# Patient Record
Sex: Male | Born: 1935 | Race: Black or African American | Hispanic: No | Smoking: Former smoker
Health system: Southern US, Community
[De-identification: ages and names within clinical notes are randomized; demographics above are authoritative.]

## PROBLEM LIST (undated history)

## (undated) DIAGNOSIS — Z950 Presence of cardiac pacemaker: Secondary | ICD-10-CM

## (undated) DIAGNOSIS — C189 Malignant neoplasm of colon, unspecified: Secondary | ICD-10-CM

## (undated) DIAGNOSIS — I442 Atrioventricular block, complete: Secondary | ICD-10-CM

## (undated) DIAGNOSIS — M1990 Unspecified osteoarthritis, unspecified site: Secondary | ICD-10-CM

## (undated) DIAGNOSIS — I739 Peripheral vascular disease, unspecified: Secondary | ICD-10-CM

## (undated) DIAGNOSIS — I10 Essential (primary) hypertension: Secondary | ICD-10-CM

## (undated) HISTORY — DX: Essential (primary) hypertension: I10

## (undated) HISTORY — PX: KNEE SURGERY: SHX244

## (undated) HISTORY — DX: Presence of cardiac pacemaker: Z95.0

## (undated) HISTORY — PX: INSERT / REPLACE / REMOVE PACEMAKER: SUR710

## (undated) HISTORY — DX: Peripheral vascular disease, unspecified: I73.9

## (undated) HISTORY — DX: Atrioventricular block, complete: I44.2

---

## 2011-08-18 ENCOUNTER — Other Ambulatory Visit (HOSPITAL_COMMUNITY): Payer: Self-pay | Admitting: Internal Medicine

## 2011-08-18 DIAGNOSIS — I70219 Atherosclerosis of native arteries of extremities with intermittent claudication, unspecified extremity: Secondary | ICD-10-CM

## 2011-08-26 ENCOUNTER — Ambulatory Visit (HOSPITAL_COMMUNITY)
Admission: RE | Admit: 2011-08-26 | Discharge: 2011-08-26 | Disposition: A | Discharge status: Home / Self Care | Payer: Medicare Other | Source: Ambulatory Visit | Attending: Internal Medicine | Admitting: Internal Medicine

## 2011-08-26 DIAGNOSIS — I743 Embolism and thrombosis of arteries of the lower extremities: Secondary | ICD-10-CM | POA: Insufficient documentation

## 2011-08-26 DIAGNOSIS — Q619 Cystic kidney disease, unspecified: Secondary | ICD-10-CM | POA: Insufficient documentation

## 2011-08-26 DIAGNOSIS — I70219 Atherosclerosis of native arteries of extremities with intermittent claudication, unspecified extremity: Secondary | ICD-10-CM | POA: Insufficient documentation

## 2011-08-26 MED ORDER — IOHEXOL 350 MG/ML SOLN
150.0000 mL | Freq: Once | INTRAVENOUS | Status: AC | PRN
  Administered 2011-08-26: 150 mL via INTRAVENOUS

## 2011-09-01 HISTORY — PX: TRANSTHORACIC ECHOCARDIOGRAM: SHX275

## 2011-09-09 HISTORY — PX: PACEMAKER INSERTION: SHX728

## 2011-11-10 LAB — PACEMAKER DEVICE OBSERVATION

## 2012-01-28 ENCOUNTER — Encounter (HOSPITAL_COMMUNITY): Payer: Self-pay | Admitting: *Deleted

## 2012-01-28 ENCOUNTER — Emergency Department (HOSPITAL_COMMUNITY)
Admission: EM | Admit: 2012-01-28 | Discharge: 2012-01-28 | Disposition: A | Discharge status: Home / Self Care | Payer: Medicare Other | Attending: Emergency Medicine | Admitting: Emergency Medicine

## 2012-01-28 ENCOUNTER — Emergency Department (HOSPITAL_COMMUNITY): Payer: Medicare Other

## 2012-01-28 DIAGNOSIS — R0602 Shortness of breath: Secondary | ICD-10-CM | POA: Insufficient documentation

## 2012-01-28 MED ORDER — ALBUTEROL SULFATE (5 MG/ML) 0.5% IN NEBU
5.0000 mg | INHALATION_SOLUTION | Freq: Once | RESPIRATORY_TRACT | Status: DC
  Filled 2012-01-28: qty 1

## 2012-07-19 IMAGING — CT CT ANGIO AOBIFEM WO/W CM
1 of 9 series · 10 of 46 positions shown, 12 images · IV contrast (Omnipaque 300)
Comparison: None.

CLINICAL DATA: Abnormal ultrasound, bilateral lower extremity
swelling and intermittent claudication, evaluate for peripheral
arterial disease.

CT ANGIOGRAPHY OF ABDOMINAL AORTA WITH ILIOFEMORAL RUNOFF
TECHNIQUE: Multidetector CT imaging of the abdomen, pelvis and
lower extremities was performed using the standard protocol during
bolus administration of intravenous contrast.  Multiplanar CT image
reconstructions including MIPs were obtained to evaluate the
vascular anatomy.
Contrast: 150mL OMNIPAQUE IOHEXOL 350 MG/ML SOLN

[Series 4: cta 3.0 b31f pacs · axial · 0.76mm/px · z∈[-1264,-48]mm · 10 of 467 slices shown, 12 images]
[im 46/467  soft-tissue]
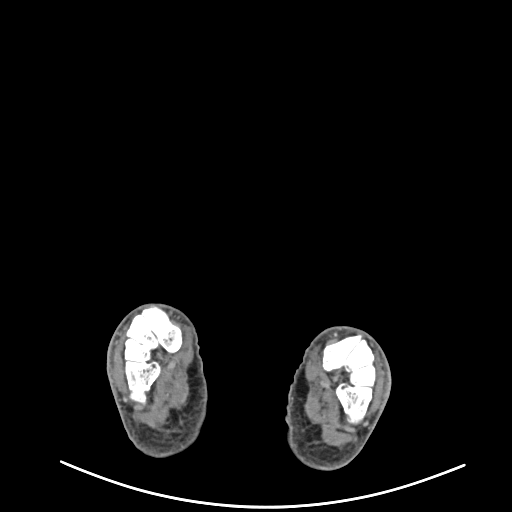
[im 46/467  bone]
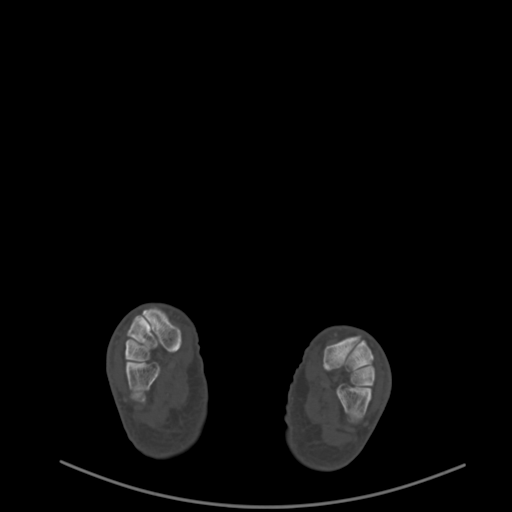
[im 106/467  soft-tissue]
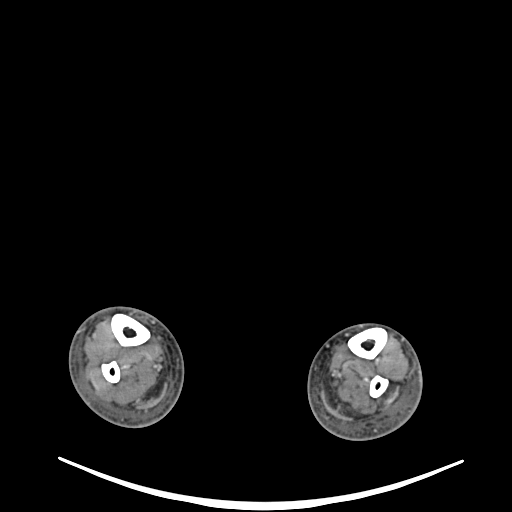
[im 166/467  soft-tissue]
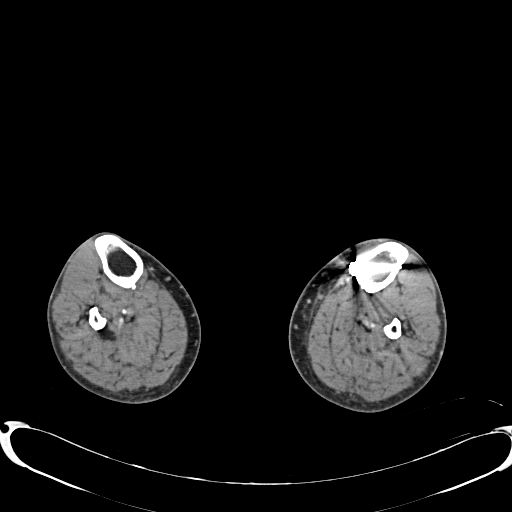
[im 241/467  soft-tissue]
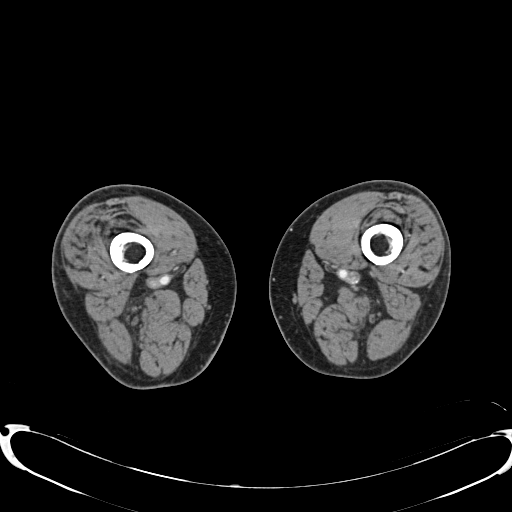
[im 301/467  soft-tissue]
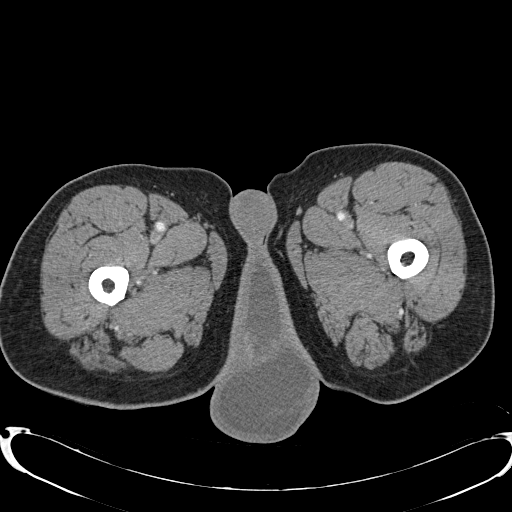
[im 361/467  soft-tissue]
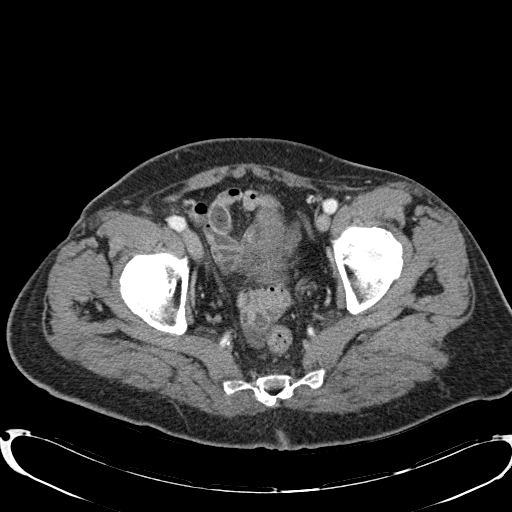
[im 406/467  lung]
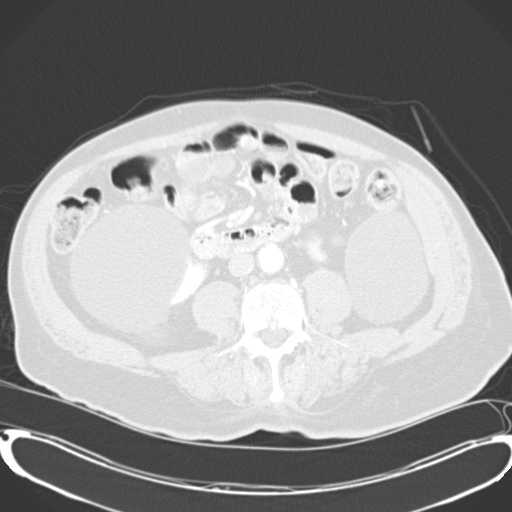
[im 421/467  soft-tissue]
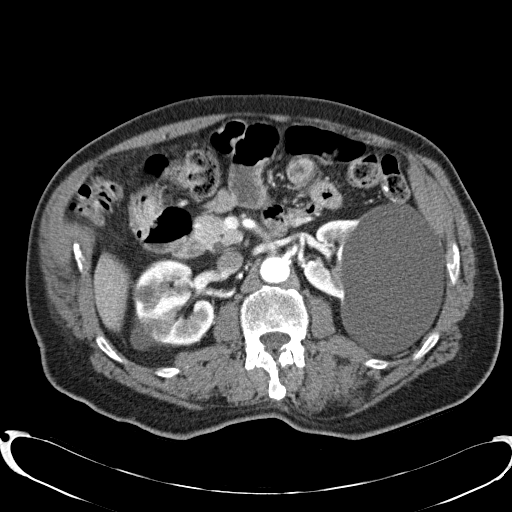
[im 421/467  lung]
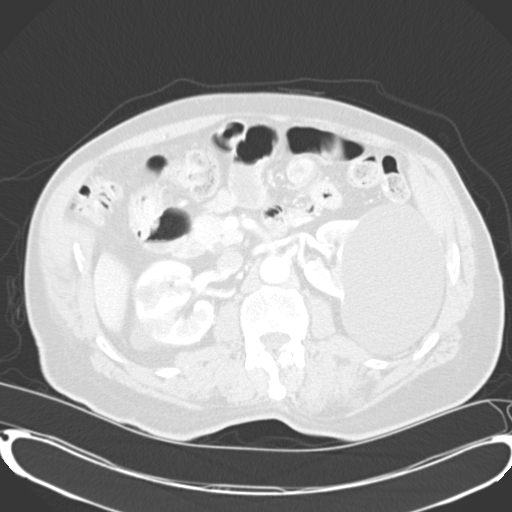
[im 436/467  lung]
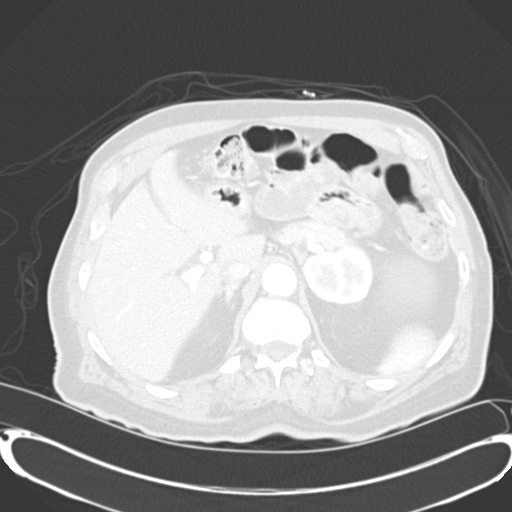
[im 451/467  lung]
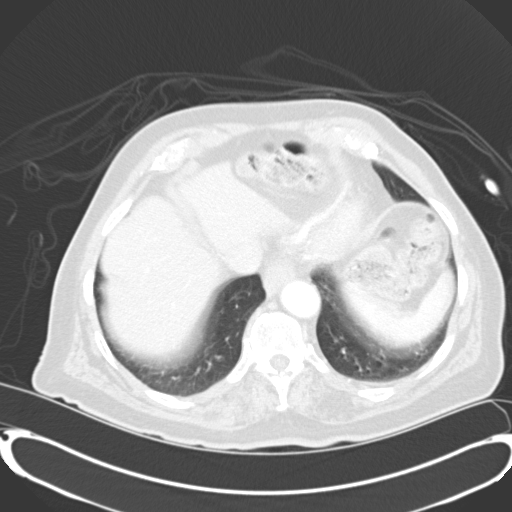

[10 of 46 positions shown; findings below may reference images not displayed]

Vascular Findings:

Aorta:  Scattered minimal atherosclerotic calcifications within the
nonaneurysmal abdominal aorta.  No aortic dissection.  No
periaortic stranding.

Celiac artery:  There is minimal primarily noncalcified
circumferential atherosclerotic plaque involving the origin and
proximal aspect of the celiac artery not resulting hemodynamically
significant stenosis.  Classical branching pattern.

SMA:  Widely patent.  Classical branching pattern.

Right renal artery:  Solitary; widely patent without
hemodynamically significant narrowing.

Left renal artery:  Solitary; widely patent without hemodynamically
significant narrowing.  The left renal artery is noted to arise
slightly anteriorly from the abdominal aorta caudal and inferior to
the left lateral aspect of the SMA origin.

IMA:  Widely patent.

RIGHT LOWER EXTREMITY:

Pelvic vasculature:  There is scattered primarily calcified
eccentric atherosclerotic plaque within the right common iliac
artery, not resulting in hemodynamically significant stenosis.  The
right external iliac artery is noted to be mildly torturuous but
largely free of atherosclerotic disease.  There is scattered
minimal atherosclerotic calcifications within a nonaneurysmal right
internal iliac artery.

Right common femoral artery:  Minimal eccentric, calcified
atherosclerotic plaque, not resulting hemodynamically significant
narrowing.

Right deep femoral artery:  Widely patent without hemodynamically
significant narrowing.

Right superficial femoral artery: Patent, however there is
significant atherosclerotic disease primarily involving the mid and
distal aspect of the right superficial femoral artery, resulting in
multiple tandem multiple tandem areas of hemodynamically
significant narrowing worse within the distal right SFA regional to
the abductor canal or Mr. calcified and noncalcified
atherosclerotic plaque results in greater than 70% luminal
narrowing (image 197, series four).

Right above-knee popliteal artery:  There is scattered
atherosclerotic plaque with a mixture of calcified 9 ounces chronic
plaque resulting in approximately 40% luminal narrowing just
superior to the patella (image 247).

Right below knee popliteal artery:  Circumferential noncalcified
atherosclerotic plaque is not resulting critically significant
narrowing.

Right lower leg:  Evaluation of the bony vasculature is somewhat
limited secondary to suboptimal vessel opacification.  The right
anterior tibial artery appears occluded shortly after its origin
(image 297 calcified atherosclerotic plaque at the origin of the
right popliteal artery with multiple areas of fairly significant
narrowing.  There is collateralization of flow to the right
posterior tibial artery is via the peroneal artery.  The posterior
tibial artery is visualized to the level of the hind foot.  Into
the dorsalis pedis artery is not definitely identified.

LEFT LOWER EXTREMITY:

Pelvic vasculature:  There is minimal eccentric calcified
atherosclerotic plaque within the left common iliac artery, not
resulting in hemodynamically significant stenosis.  The left
external iliac artery is mildly tortuous but free of
hemodynamically significant narrowing.  There is no significant
atherosclerotic disease within the left internal iliac artery.

Left common femoral artery:  There is a minimal amount of eccentric
atherosclerotic plaque within the left common femoral artery, not
resulting in hemodynamically significant narrowing.

Left deep femoral artery:  Widely patent without hemodynamically
significant narrowing.

Left superficial femoral artery: Primarily noncalcified eccentric
atherosclerotic plaque involving the origin and proximal aspect of
the left superficial femoral results in greater than 50% luminal
narrowing (image 122).  There is multifocal tandem severe narrowing
within the distal aspect of the left superficial femoral artery
regional to the abductor canal resulting in several areas of
greater than 70% luminal narrowing (images 191 and 202).

Left above-knee popliteal artery:  A mixture of calcified and
noncalcified eccentric atherosclerotic plaque within the above-knee
popliteal artery does not result in hemodynamically significant
narrowing.

Left below-knee popliteal artery:  Predominantly noncalcified
concentric atherosclerotic plaque does not result in
hemodynamically significant narrowing.

Left lower leg:  Evaluation of the lower right vasculature is
degraded secondary to suboptimal vessel calcification and scattered
vascular calcifications, drain evaluation for intraluminal
contrast.  Both the left sided anterior and posterior tibial
arteries appear occluded at their origins.  There is a single
vessel runoff to the left foot via the left peroneal artery.  The
peroneal artery is noted to reconstitute the distal aspect of the
left posterior tibial artery above the level of the ankle (image
387).  A patent left sided dorsalis pedis artery is not
definitively identified.

 Review of the MIP images confirms the above findings.

Nonvascular findings:

Review of the abdominal organs is limited to the arterial phase of
enhancement.

Normal hepatic contour.  There is a hypoattenuating (7 HU) 1.4 cm
cyst within the medial aspect of the anterior segment of the right
lobe of the liver (image 27, series 4).  The gallbladder is not
visualized, presumably surgically absent.  No definite intra or
extrahepatic biliary duct dilatation.  No ascites.

There is symmetric enhancement of the bilateral kidneys.  Multiple
large bilateral exophytic renal cysts are identified with a
dominant exophytic cyst arising from the inferior pole right kidney
measuring approximately 10.2 x 8.7 by 9.3 cm (axial image 70,
coronal image 47 and dominant partially exophytic cyst arising from
the mid pole of the left kidney measuring approximately 11.7 x
x 12.4 cm (axial image 48, coronal image 14 of 49).  No definite
evidence of urinary obstruction.  No perinephric stranding.  Normal
early arterial phase appearance of the bilateral adrenal glands,
pancreas and spleen.  Incidental note is made of a small splenule.

Moderate colonic stool burden without definite evidence of
obstruction.  Unremarkable diverticular burden given the patient's
age.  Bowel is normal in course and caliber without wall thickening
or evidence of obstruction.  Normal appearance of the appendix.  No
pneumoperitoneum, pneumatosis or portal venous gas.  No
retroperitoneal, mesenteric, pelvic or inguinal lymphadenopathy.

Borderline enlarged prostate.  There is a small amount of free
fluid within the pelvis. Note is made of large bilateral
hydroceles.

Limited visualization of the lung bases is obscured secondary to
patient respiratory artifact.  There is minimal linear atelectasis
within the inferior segment of the lingula.  No focal airspace
opacities.  No pleural effusion.  Borderline cardiomegaly.  No
pericardial effusion.

No acute or aggressive osseous abnormalities..  Multilevel moderate
to severe lumbar spine degenerative change, worst at L4 - L5 with
near complete vertebral body height loss and para osseous fusion of
the intervertebral disc space level.  Ex vacuo disc phenomenon seen
at L2 - L3-L3 - L4. Bilateral knee degenerative change. Post side
plate and screw fixation of prior tibial plateau fracture.  No
definite evidence of hardware failure or loosening.
IMPRESSION: 1.  Negative for abdominal aortic aneurysm.

2.  Multi focal tandem areas of hemodynamically significant
narrowing within the mid and distal aspect of the right superficial
femoral artery with greater than 70% luminal narrowing at the level
of the adductor canal.

3.  Two-vessel runoff to the right foot via the posterior tibial
and peroneal arteries.  The right anterior tibial artery is
occluded at its origin.  Non opacification of the right dorsalis
pedis artery.

4. Eccentric noncalcified atherosclerotic plaque at the origin of
the left superficial femoral artery likely results in greater than
50% luminal narrowing.  Multifocal tandem areas of hemodynamically
significant narrowing within the mid and distal aspect of left
superficial femoral artery results in several areas of greater than
70% luminal narrowing at the level of the abductor canal.

5. Single vessel runoff to the left foot via the peroneal artery.
The left posterior and anterior left tibial arteries are occluded
at their origin.  The peroneal artery appears to reconstitute the
distal aspect of the left posterior tibial artery superior to the
ankle. Non  opacification of the left dorsalis pedis artery.

6.  Hepatic cyst and multiple large exophytic bilateral renal cysts
are incidentally noted.

7.  Incidental note is made of large bilateral hydroceles.

## 2012-08-02 ENCOUNTER — Other Ambulatory Visit (HOSPITAL_COMMUNITY): Payer: Self-pay | Admitting: Internal Medicine

## 2012-08-02 DIAGNOSIS — I739 Peripheral vascular disease, unspecified: Secondary | ICD-10-CM

## 2012-08-12 LAB — PACEMAKER DEVICE OBSERVATION

## 2012-08-14 ENCOUNTER — Ambulatory Visit (HOSPITAL_COMMUNITY)
Admission: RE | Admit: 2012-08-14 | Discharge: 2012-08-14 | Disposition: A | Discharge status: Home / Self Care | Payer: Medicare Other | Source: Ambulatory Visit | Attending: Internal Medicine | Admitting: Internal Medicine

## 2012-08-14 DIAGNOSIS — I739 Peripheral vascular disease, unspecified: Secondary | ICD-10-CM

## 2012-08-14 NOTE — Progress Notes (Signed)
ABIs and lower extremity arterial duplex doppler was completed.  Larene Pickett RVT

## 2012-09-08 ENCOUNTER — Encounter: Payer: Self-pay | Admitting: Internal Medicine

## 2012-09-15 ENCOUNTER — Ambulatory Visit (INDEPENDENT_AMBULATORY_CARE_PROVIDER_SITE_OTHER): Payer: Medicare Other | Admitting: Internal Medicine

## 2012-09-15 ENCOUNTER — Encounter: Payer: Self-pay | Admitting: Internal Medicine

## 2012-09-15 VITALS — BP 150/58 | HR 96 | Ht 71.0 in | Wt 200.0 lb

## 2012-09-15 DIAGNOSIS — R5381 Other malaise: Secondary | ICD-10-CM

## 2012-09-15 DIAGNOSIS — M545 Low back pain, unspecified: Secondary | ICD-10-CM

## 2012-09-15 DIAGNOSIS — I442 Atrioventricular block, complete: Secondary | ICD-10-CM

## 2012-09-15 DIAGNOSIS — Z950 Presence of cardiac pacemaker: Secondary | ICD-10-CM

## 2012-09-15 DIAGNOSIS — R0989 Other specified symptoms and signs involving the circulatory and respiratory systems: Secondary | ICD-10-CM

## 2012-09-15 DIAGNOSIS — R0609 Other forms of dyspnea: Secondary | ICD-10-CM

## 2012-09-15 DIAGNOSIS — I739 Peripheral vascular disease, unspecified: Secondary | ICD-10-CM

## 2012-09-15 DIAGNOSIS — R5383 Other fatigue: Secondary | ICD-10-CM

## 2012-09-15 DIAGNOSIS — I10 Essential (primary) hypertension: Secondary | ICD-10-CM

## 2012-09-15 MED ORDER — TRAMADOL HCL 50 MG PO TABS: 50.0000 mg | ORAL_TABLET | Freq: Four times a day (QID) | ORAL | Status: DC | PRN

## 2012-09-15 NOTE — Progress Notes (Signed)
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 Recently, unfortunately, he had developed heart block and underwent a pacemaker and has been followed by Dr. Royann Shivers in our practice. The pacemaker was recently interrogated and shows that he does have an escape rhythm at about 40 beats per minute and seems to be doing well, showing about 30% atrial pacing, 97.5% ventricular pacing. He is set up with a [employer] remote monitoring system which seems to be working. His main complaint today is progressive fatigue. He has been having that for over the past 6 months with some associated shortness of breath. He had recurrent cough, which I thought was due to ACE-I and I switched him to an ARB.  The family said it did not improve, so he went back to lisinopril HCTZ.  He also has severe PAD. Recent repeat dopplers demonstrate ABI's of 0.51 on the right and 0.66 on the left. Both distal SFA's appear near occluded and there are occluded anterior and posterior tibial arteries bilaterally. Despite this, however, he reports no claudication. In fact, he reports pain when lying flat on his back (?pseudoclaudication). He flatly denies angina.    PMHx:  Past Medical History  Diagnosis Date  . Peripheral arterial disease     Arterial dopplers done  08/10/2011  CT angiogram 08/26/2011 showed reduced ABIs bilaterally with ABI of 0.54on the right and 0.72 on the left  . Hypertension     Past Surgical History  Procedure Laterality Date  . Knee surgery    . Pacemaker insertion  09/09/2011    Adapta Versa DR Dual chamber  Medtronic  last checked 11/10/2011,      FAMHx:  Family History  Problem Relation Age of Onset  . Cancer Son     colon  . Cancer Son     brain    SOCHx:   reports that he has quit smoking. He does not have any smokeless tobacco history on file. He reports that he does not drink alcohol or use illicit drugs.  ALLERGIES:  No Known Allergies  ROS: A comprehensive review of systems was negative except for: Respiratory: positive for cough and dyspnea on exertion Cardiovascular: positive for dyspnea and fatigue Musculoskeletal: positive for back pain Neurological: positive for paresthesia  HOME MEDS: Current Outpatient Prescriptions  Medication Sig Dispense Refill  . aspirin EC 81 MG tablet Take 81 mg by mouth daily.      . ferrous sulfate 325 (65 FE) MG tablet Take 325 mg by mouth daily with breakfast.      . lisinopril-hydrochlorothiazide (PRINZIDE,ZESTORETIC) 20-25 MG per tablet Take 20-25 tablets by mouth daily.      . valsartan-hydrochlorothiazide (DIOVAN-HCT) 160-25 MG per tablet Take 1 tablet by mouth daily.       No current facility-administered medications for this visit.    LABS/IMAGING: No results found for this or any previous visit (from the past 48 hour(s)). No results found.  VITALS: BP 150/58  Pulse 96  Ht 5\' 11"  (1.803 m)  Wt 200 lb (90.719 kg)  BMI 27.91 kg/m2  EXAM: General appearance: alert, appears older  than stated age and no distress Neck: no adenopathy, no carotid bruit, no JVD, supple, symmetrical, trachea midline and thyroid not enlarged, symmetric, no tenderness/mass/nodules Lungs: diminished breath sounds bibasilar Heart: regular rate and rhythm, S1, S2 normal, no murmur, click, rub or gallop Abdomen: soft, non-tender; bowel sounds normal; no masses,  no organomegaly Extremities: extremities normal, atraumatic, no cyanosis or edema Pulses: weak pulses bilaterally Skin: Skin color, texture, turgor normal. No rashes or lesions Neurologic: Grossly normal  EKG: Not performed  today  ASSESSMENT: 1. Complete heart block, s/p Medtronic dual chamber pacemaker 2. PAD without lifestyle limiting claudication 3. Pseudoclaudication 4. Hypertension 5. Dyspnea on exertion and progressive fatigue  PLAN: 1.   Mr. Meuth has been describing more dyspnea with exertion and fatigue. Given his significant PAD, he is at much higher risk for CAD. I would like to obtain a stress test (he cannot walk on a treamill) to look for obstructive disease as a cause of his progressive fatigue and dyspnea. He does seem to report pseudoclaudication symptoms (leg pain worse when lying flat on his back), which make me think that he may have spinal stenosis of a lumbar spine problem. I will defer to his PCP to evaluate this. I have provided a short supply of tramadol for him to see if this helps his pain to take as needed. We will follow-up after the results of the stress test are available.  He is scheduled for a remote pacer check in 10/2012.  Chrystie Nose, MD, [facility] Attending Cardiologist [facility]  Tonio Seider C 09/15/2012, 6:46 PM

## 2012-09-15 NOTE — Patient Instructions (Signed)
Starting Tramadol 50 mg 1/2 tablet every 6 hours as needed for pain.  Dr. Rennis Golden has schedule a test  Lexiscan for you because of shornesst of breathe.  Your physician has requested that you have a lexiscan myoview.

## 2012-09-18 ENCOUNTER — Other Ambulatory Visit: Payer: Self-pay | Admitting: *Deleted

## 2012-09-18 DIAGNOSIS — M545 Low back pain: Secondary | ICD-10-CM

## 2012-09-18 MED ORDER — TRAMADOL HCL 50 MG PO TABS: 50.0000 mg | ORAL_TABLET | Freq: Four times a day (QID) | ORAL | Status: DC | PRN

## 2012-09-26 ENCOUNTER — Ambulatory Visit: Payer: Medicare Other | Admitting: Internal Medicine

## 2012-09-26 ENCOUNTER — Encounter: Payer: Self-pay | Admitting: Internal Medicine

## 2012-10-06 ENCOUNTER — Ambulatory Visit (HOSPITAL_COMMUNITY)
Admission: RE | Admit: 2012-10-06 | Discharge: 2012-10-06 | Disposition: A | Discharge status: Home / Self Care | Payer: Medicare Other | Source: Ambulatory Visit | Attending: Internal Medicine | Admitting: Internal Medicine

## 2012-10-06 DIAGNOSIS — R0989 Other specified symptoms and signs involving the circulatory and respiratory systems: Secondary | ICD-10-CM

## 2012-10-06 DIAGNOSIS — R0602 Shortness of breath: Secondary | ICD-10-CM | POA: Insufficient documentation

## 2012-10-06 DIAGNOSIS — Z950 Presence of cardiac pacemaker: Secondary | ICD-10-CM

## 2012-10-06 DIAGNOSIS — R0609 Other forms of dyspnea: Secondary | ICD-10-CM | POA: Insufficient documentation

## 2012-10-06 DIAGNOSIS — Z87891 Personal history of nicotine dependence: Secondary | ICD-10-CM | POA: Insufficient documentation

## 2012-10-06 DIAGNOSIS — I10 Essential (primary) hypertension: Secondary | ICD-10-CM | POA: Insufficient documentation

## 2012-10-06 DIAGNOSIS — I739 Peripheral vascular disease, unspecified: Secondary | ICD-10-CM

## 2012-10-06 DIAGNOSIS — R5383 Other fatigue: Secondary | ICD-10-CM

## 2012-10-06 DIAGNOSIS — R42 Dizziness and giddiness: Secondary | ICD-10-CM | POA: Insufficient documentation

## 2012-10-06 DIAGNOSIS — R5381 Other malaise: Secondary | ICD-10-CM | POA: Insufficient documentation

## 2012-10-06 MED ORDER — TECHNETIUM TC 99M SESTAMIBI GENERIC - CARDIOLITE
10.3000 | Freq: Once | INTRAVENOUS | Status: AC | PRN
  Administered 2012-10-06: 10 via INTRAVENOUS

## 2012-10-06 MED ORDER — REGADENOSON 0.4 MG/5ML IV SOLN
0.4000 mg | Freq: Once | INTRAVENOUS | Status: AC
  Administered 2012-10-06: 0.4 mg via INTRAVENOUS

## 2012-10-06 MED ORDER — TECHNETIUM TC 99M SESTAMIBI GENERIC - CARDIOLITE
30.1000 | Freq: Once | INTRAVENOUS | Status: AC | PRN
  Administered 2012-10-06: 30.1 via INTRAVENOUS

## 2012-10-06 NOTE — Procedures (Addendum)
[facility] [address] [place] [address] [phone]  Cardiology Nuclear Med Study  Danny Misuraca Sr. is a 79 y.o. male     MRN : [id number]     DOB: 1921-05-17  Procedure Date: 10/06/2012  Nuclear Med Background Indication for Stress Test:  Evaluation for Ischemia History:  CAD;MI;PACER Cardiac Risk Factors: History of Smoking, Hypertension and PVD  Symptoms:  Dizziness, DOE, Fatigue and SOB   Nuclear Pre-Procedure Caffeine/Decaff Intake:  10:00pm NPO After: 8:00am   IV Site: R Antecubital  IV 0.9% NS with Angio Cath:  22g  Chest Size (in):  42"  IV Started by: Emmit Pomfret, RN  Height: 5\' 11"  (1.803 m)  Cup Size: n/a  BMI:  Body mass index is 27.91 kg/(m^2). Weight:  200 lb (90.719 kg)   Tech Comments:  N/A    Nuclear Med Study 1 or 2 day study: 1 day  Stress Test Type:  Lexiscan  Order Authorizing Provider:  Zoila Shutter, MD   Resting Radionuclide: Technetium 80m Sestamibi  Resting Radionuclide Dose: 10.3 mCi   Stress Radionuclide:  Technetium 47m Sestamibi  Stress Radionuclide Dose: 30.1 mCi           Stress Protocol Rest HR: 74 Stress HR: 65  Rest BP: 151/87 Stress BP: 148/80  Exercise Time (min): n/a METS: n/a   Predicted Max HR: 132 bpm % Max HR: 56.06 bpm Rate Pressure Product: 62952  Dose of Adenosine (mg):  n/a Dose of Lexiscan: 0.4 mg  Dose of Atropine (mg): n/a Dose of Dobutamine: n/a mcg/kg/min (at max HR)  Stress Test Technologist: Esperanza Sheets, CCT Nuclear Technologist: Gonzella Lex, CNMT   Rest Procedure:  Myocardial perfusion imaging was performed at rest 45 minutes following the intravenous administration of Technetium 16m Sestamibi. Stress Procedure:  The patient received IV Lexiscan 0.4 mg over 15-seconds.  Technetium 52m Sestamibi injected at 30-seconds.  There were no significant changes with Lexiscan.  Quantitative spect images were obtained after a 45 minute  delay.  Transient Ischemic Dilatation (Normal <1.22):  0.99 Lung/Heart Ratio (Normal <0.45):  0.26 QGS EDV:  67 ml QGS ESV:  24 ml LV Ejection Fraction: 64%  Signed by Gonzella Lex, CNMT  PHYSICIAN INTERPRETATION  Rest ECG: A-V paced, A sensed V Paced rhythm.  Stress ECG: No significant change from baseline ECG  QPS Raw Data Images:  Mild diaphragmatic attenuation.  Normal left ventricular size. Stress Images:  There is decreased uptake in the apex.  There is decreased uptake in the inferior wall. There is a small sized, mild intensity mostly fixed perfusion defect involving the inferoapical wall. Rest Images:  There is decreased uptake in the inferior wall. There is decreased uptake in the apex. Comparison with the stress images reveals no significant change. Subtraction (SDS):  There is a fixed inferoapical perfusion defect with apical wall motion abnormality and abnormal thickening suggesting a small inferoapical infarction. :  No reversibility is appreciated. :  No evidence of ischemia.   Impression Exercise Capacity:  Lexiscan with no exercise. BP Response:  Normal blood pressure response. Clinical Symptoms:  There is dyspnea. ECG Impression:  Baseline:  V Pacing.  EKG uninterpretable due to V pacing at rest and stress.  LV Wall Motion:  NL LV Function; NL Wall Motion with the exception of mild apical hypokinesis. Comparison with Prior Nuclear Study: No images to compare  Overall Impression:  Low risk stress nuclear study with a small fixed apical defect  suggestive of a small inferoapical infarction.Marland Kitchen    Marykay Lex, MD  10/06/2012 7:06 PM

## 2012-10-13 ENCOUNTER — Telehealth: Payer: Self-pay | Admitting: Internal Medicine

## 2012-10-13 NOTE — Telephone Encounter (Signed)
Wants test results again-could not understand the message-person was going too fast!s

## 2012-10-13 NOTE — Telephone Encounter (Signed)
Returned call and results given per MD.  Verbalized understanding.

## 2012-10-23 ENCOUNTER — Encounter: Payer: Self-pay | Admitting: Internal Medicine

## 2012-11-17 ENCOUNTER — Ambulatory Visit (INDEPENDENT_AMBULATORY_CARE_PROVIDER_SITE_OTHER): Payer: Medicare Other | Admitting: Cardiovascular Disease

## 2012-11-17 ENCOUNTER — Other Ambulatory Visit: Payer: Self-pay | Admitting: Cardiovascular Disease

## 2012-11-17 ENCOUNTER — Encounter: Payer: Self-pay | Admitting: Cardiovascular Disease

## 2012-11-17 VITALS — BP 168/82 | HR 79 | Resp 20 | Ht 71.0 in | Wt 199.8 lb

## 2012-11-17 DIAGNOSIS — I471 Supraventricular tachycardia: Secondary | ICD-10-CM

## 2012-11-17 DIAGNOSIS — I10 Essential (primary) hypertension: Secondary | ICD-10-CM

## 2012-11-17 DIAGNOSIS — Z950 Presence of cardiac pacemaker: Secondary | ICD-10-CM

## 2012-11-17 DIAGNOSIS — I472 Ventricular tachycardia: Secondary | ICD-10-CM

## 2012-11-17 DIAGNOSIS — I739 Peripheral vascular disease, unspecified: Secondary | ICD-10-CM

## 2012-11-17 DIAGNOSIS — I442 Atrioventricular block, complete: Secondary | ICD-10-CM

## 2012-11-17 LAB — PACEMAKER DEVICE OBSERVATION

## 2012-11-17 MED ORDER — PENTOXIFYLLINE ER 400 MG PO TBCR: 400.0000 mg | EXTENDED_RELEASE_TABLET | Freq: Three times a day (TID) | ORAL | Status: DC

## 2012-11-17 NOTE — Patient Instructions (Addendum)
We will refer you to see Dr. Nanetta Batty to discuss possible angioplasty for blockages in leg arteries.  Start taking pentoxifylline (Trental) 400 mg repeat times daily to help with leg pain at rest.  Continue pacemaker downloads ever 3 months. The next download is due 02/17/2013.  Followup for an in office pacemaker check in one year

## 2012-11-18 ENCOUNTER — Encounter: Payer: Self-pay | Admitting: Cardiovascular Disease

## 2012-11-18 DIAGNOSIS — I471 Supraventricular tachycardia: Secondary | ICD-10-CM | POA: Insufficient documentation

## 2012-11-18 DIAGNOSIS — I472 Ventricular tachycardia: Secondary | ICD-10-CM | POA: Insufficient documentation

## 2012-11-18 DIAGNOSIS — I4729 Other ventricular tachycardia: Secondary | ICD-10-CM | POA: Insufficient documentation

## 2012-11-18 NOTE — Assessment & Plan Note (Addendum)
Insufficient control. Beta blockers should be avoided because of the severity of his peripheral arterial disease. Vasodilators are preferable. Need to use caution with optimal blood pressure control to avoid risk of worsening limb ischemia.

## 2012-11-18 NOTE — Assessment & Plan Note (Signed)
Asymptomatic, pacemaker detected. Ideally he should be placed on beta blockers but his lower showed ischemia is really concerning.

## 2012-11-18 NOTE — Assessment & Plan Note (Signed)
He has diabetes and ventricular pacing but does have a ventricular escape rhythm at 45 beats per minute that has been consistently present at all recent pacemaker checks

## 2012-11-18 NOTE — Assessment & Plan Note (Signed)
This arrhythmia has been present in the past generally brief and well tolerated, but may consider central acting calcium channel blockers

## 2012-11-18 NOTE — Assessment & Plan Note (Signed)
Normal pacemaker function. The device was fully interrogated included active testing of pacing thresholds today. The device is a dual chamber Medtronic versa implanted in May of 2013 for AVblock. He has 100 ventricular pacing but is not technically pacemaker dependent since he has a fairly consistent idioventricular escape rhythm at about 45 beats per minute. There is also 53% atrial pacing consistent with sinus node dysfunction.  Batter longevity shows approximately 9.5 years. Both leads are Medtronic 5076 leads. Atrial lead impedance is 401 ohms, P waves 2 mV, threshold 0.5 V at 0.4 ms; right ventricular lead impedance 403 ohms, R waves 8 mV, pressure of 0.625 V at 0.4 ms. Heart rate histograms show a rather flat distribution suggestive of chronotropic incompetence. Currently this may be due to sedentary lifestyle. There numerous episodes of mode switch but the vast majority are very brief period the longest episode lasted for 2 minutes and 25 seconds. All of them appear consistent with paroxysmal atrial tachycardia there are also a few episodes of high ventricular rates one of which suggests nonsustained ventricular tachycardia consisting of about 9 beats.  Data recording was changed to atrial DG in storage at 150 beats per minute. The center was made slightly more aggressive. No other changes were made to device setting today. He will continue checking the device via the remote caroling system on an every 3 month basis.

## 2012-11-18 NOTE — Assessment & Plan Note (Signed)
He describes a severe resting leg pain consistent with severe ischemia. He has to dangle his feet over the side of the bed every 2 hours at night. Is consistent with his previous noninvasive studies which showed severely reduced blood flow due to a combination of high-grade superficial femoral artery stenoses and severe infrapopliteal disease with one-vessel runoff. He was previously evaluated by Dr. Gery Pray but decided on conservative management. I think he should be reevaluated since he is at true risk of limb loss. I told him he needs report any breaks in the skin or any signs of potential arterial occlusion immediately. While we wait for his reevaluation given a prescription for pentoxifylline 400 mg 3 times daily (unfortunately his insurance does not cover Pletal).

## 2012-11-18 NOTE — Progress Notes (Signed)
Patient ID: Danny Eckerson Sr., male   DOB: Mar 13, 1922, 79 y.o.   MRN: [id number]     Reason for office visit Pacemaker followup, ischemic leg pain at rest   Danny Buck is here for followup on his pacemaker implanted for complete heart block in May of 2013. He has not had problems with syncope and has rare palpitations. The device is functioning normally as described in the pacemaker check below. While here he told me that he has to wake up every 2 hours at night to dangle his feet over the side of the bed because of pain. The symptoms of pain are relieved by dangling his legs. He also has severe intermittent claudication and is only able to walk for short distances without leg pain. He is known to have severe arterial obstruction with high-grade stenoses especially in the distal right superficial femoral artery but also in the distal left superficial femoral artery. Unfortunately also has marked infrapopliteal disease with two-vessel runoff bilaterally. Both posterior tibial and anterior tibial arteries are occluded). The ABI was 0.51 on the left and 0.66 on the left in April of this year   No Known Allergies  Current Outpatient Prescriptions  Medication Sig Dispense Refill  . aspirin EC 81 MG tablet Take 81 mg by mouth daily.      . ferrous sulfate 325 (65 FE) MG tablet Take 325 mg by mouth daily with breakfast.      . lisinopril-hydrochlorothiazide (PRINZIDE,ZESTORETIC) 20-25 MG per tablet Take 20-25 tablets by mouth daily.      . pentoxifylline (TRENTAL) 400 MG CR tablet Take 1 tablet (400 mg total) by mouth 3 (three) times daily with meals.  90 tablet  3   No current facility-administered medications for this visit.    Past Medical History  Diagnosis Date  . Peripheral arterial disease     Arterial dopplers done  08/10/2011  CT angiogram 08/26/2011 showed reduced ABIs bilaterally with ABI of 0.54on the right and 0.72 on the left  . Hypertension     Past Surgical History    Procedure Laterality Date  . Knee surgery    . Pacemaker insertion  09/09/2011    Adapta Versa DR Dual chamber  Medtronic  last checked 11/10/2011,     Family History  Problem Relation Age of Onset  . Cancer Son     colon  . Cancer Son     brain    History   Social History  . Marital Status: Widowed    Spouse Name: N/A    Number of Children: 8  . Years of Education: N/A   Occupational History  .     Social History Main Topics  . Smoking status: Former Games developer  . Smokeless tobacco: Former Neurosurgeon    Quit date: 05/03/1963     Comment: quit in 1964  . Alcohol Use: No  . Drug Use: No  . Sexually Active: Not on file   Other Topics Concern  . Not on file   Social History Narrative  . No narrative on file    Review of systems: The patient specifically denies any chest pain at rest or with exertion, dyspnea at rest or with exertion, orthopnea, paroxysmal nocturnal dyspnea, syncope, palpitations, focal neurological deficits,lower extremity edema, unexplained weight gain, cough, hemoptysis or wheezing.  The patient also denies abdominal pain, nausea, vomiting, dysphagia, diarrhea, constipation, polyuria, polydipsia, dysuria, hematuria, frequency, urgency, abnormal bleeding or bruising, fever, chills, unexpected weight changes, mood swings, change in skin or  hair texture, change in voice quality, auditory or visual problems, allergic reactions or rashes, new musculoskeletal complaints other than usual "aches and pains".   PHYSICAL EXAM BP 168/82  Pulse 79  Resp 20  Ht 5\' 11"  (1.803 m)  Wt 199 lb 12.8 oz (90.629 kg)  BMI 27.88 kg/m2  General: Alert, oriented x3, no distress Head: no evidence of trauma, PERRL, EOMI, no exophtalmos or lid lag, no myxedema, no xanthelasma; normal ears, nose and oropharynx Neck: normal jugular venous pulsations and no hepatojugular reflux; brisk carotid pulses without delay and no carotid bruits Chest: clear to auscultation, no signs of  consolidation by percussion or palpation, normal fremitus, symmetrical and full respiratory excursions; left subclavian pacemaker site Cardiovascular: normal position and quality of the apical impulse, regular rhythm, normal first and second heart sounds, no murmurs, rubs or gallops Abdomen: no tenderness or distention, no masses by palpation, no abnormal pulsatility or arterial bruits, normal bowel sounds, no hepatosplenomegaly Extremities: no clubbing, cyanosis or edema; 2+ radial, ulnar and brachial pulses bilaterally; 2+ right femoral, nonpalpable posterior tibial and dorsalis pedis pulses; 2+ left femoral, nonpalpable posterior tibial and dorsalis pedis pulses; bilateral femoral bruits. Both feet are reddish and cool with slightly delayed capillary refill no obvious lesions of the skin are seen Neurological: grossly nonfocal   EKG: Atrial sensed ventricular paced rhythm  Lipid Panel  No results found for this basename: chol, trig, hdl, cholhdl, vldl, ldlcalc    BMET No results found for this basename: na, k, cl, co2, glucose, bun, creatinine, calcium, gfrnonaa, gfraa     ASSESSMENT AND PLAN Presence of permanent cardiac pacemaker Normal pacemaker function. The device was fully interrogated included active testing of pacing thresholds today. The device is a dual chamber Medtronic versa implanted in May of 2013 for AVblock. He has 100 ventricular pacing but is not technically pacemaker dependent since he has a fairly consistent idioventricular escape rhythm at about 45 beats per minute. There is also 53% atrial pacing consistent with sinus node dysfunction.  Batter longevity shows approximately 9.5 years. Both leads are Medtronic 5076 leads. Atrial lead impedance is 401 ohms, P waves 2 mV, threshold 0.5 V at 0.4 ms; right ventricular lead impedance 403 ohms, R waves 8 mV, pressure of 0.625 V at 0.4 ms. Heart rate histograms show a rather flat distribution suggestive of chronotropic  incompetence. Currently this may be due to sedentary lifestyle. There numerous episodes of mode switch but the vast majority are very brief period the longest episode lasted for 2 minutes and 25 seconds. All of them appear consistent with paroxysmal atrial tachycardia there are also a few episodes of high ventricular rates one of which suggests nonsustained ventricular tachycardia consisting of about 9 beats.  Data recording was changed to atrial DG in storage at 150 beats per minute. The center was made slightly more aggressive. No other changes were made to device setting today. He will continue checking the device via the remote caroling system on an every 3 month basis.  PAD (peripheral artery disease) He describes a severe resting leg pain consistent with severe ischemia. He has to dangle his feet over the side of the bed every 2 hours at night. Is consistent with his previous noninvasive studies which showed severely reduced blood flow due to a combination of high-grade superficial femoral artery stenoses and severe infrapopliteal disease with one-vessel runoff. He was previously evaluated by Dr. Gery Pray but decided on conservative management. I think he should be reevaluated since he is  at true risk of limb loss. I told him he needs report any breaks in the skin or any signs of potential arterial occlusion immediately. While we wait for his reevaluation given a prescription for pentoxifylline 400 mg 3 times daily (unfortunately his insurance does not cover Pletal).  Essential hypertension Insufficient control. Beta blockers should be avoided because of the severity of his peripheral arterial disease. Vasodilators are preferable. Need to use caution with optimal blood pressure control to avoid risk of worsening limb ischemia.  Complete heart block He has diabetes and ventricular pacing but does have a ventricular escape rhythm at 45 beats per minute that has been consistently present at all recent  pacemaker checks  Nonsustained ventricular tachycardia Asymptomatic, pacemaker detected. Ideally he should be placed on beta blockers but his lower showed ischemia is really concerning.  Paroxysmal atrial tachycardia This arrhythmia has been present in the past generally brief and well tolerated, but may consider central acting calcium channel blockers  Orders Placed This Encounter  Procedures  . EKG 12-Lead   Meds ordered this encounter  Medications  . pentoxifylline (TRENTAL) 400 MG CR tablet    Sig: Take 1 tablet (400 mg total) by mouth 3 (three) times daily with meals.    Dispense:  90 tablet    Refill:  3    Jashan Cotten  Thurmon Fair, MD, [facility] [phone] office [phone] pager

## 2012-11-23 LAB — PACEMAKER DEVICE OBSERVATION
AL AMPLITUDE: 2 mv
AL THRESHOLD: 0.5 V
BAMS-0001: 150 {beats}/min
BATTERY VOLTAGE: 2.78 V
RV LEAD AMPLITUDE: 8 mv
RV LEAD THRESHOLD: 0.625 V

## 2012-12-01 ENCOUNTER — Ambulatory Visit (INDEPENDENT_AMBULATORY_CARE_PROVIDER_SITE_OTHER): Payer: Medicare Other | Admitting: Cardiovascular Disease

## 2012-12-01 ENCOUNTER — Encounter: Payer: Self-pay | Admitting: Cardiovascular Disease

## 2012-12-01 VITALS — BP 140/68 | HR 82 | Ht 71.0 in | Wt 197.9 lb

## 2012-12-01 DIAGNOSIS — F17200 Nicotine dependence, unspecified, uncomplicated: Secondary | ICD-10-CM

## 2012-12-01 DIAGNOSIS — Z79899 Other long term (current) drug therapy: Secondary | ICD-10-CM

## 2012-12-01 DIAGNOSIS — Z720 Tobacco use: Secondary | ICD-10-CM

## 2012-12-01 DIAGNOSIS — D689 Coagulation defect, unspecified: Secondary | ICD-10-CM

## 2012-12-01 DIAGNOSIS — Z01818 Encounter for other preprocedural examination: Secondary | ICD-10-CM

## 2012-12-01 DIAGNOSIS — I739 Peripheral vascular disease, unspecified: Secondary | ICD-10-CM

## 2012-12-01 NOTE — Patient Instructions (Addendum)
Dr. Allyson Sabal has ordered a peripheral angiogram to be done at [facility].  This procedure is going to look at the bloodflow in your lower extremities.  If Dr. Allyson Sabal is able to open up the arteries, you will have to spend one night in the hospital.  If he is not able to open the arteries, you will be able to go home that same day.    After the procedure, you will not be allowed to drive for 3 days or push, pull, or lift anything greater than 10 lbs for one week.    You will be required to have bloodwork and a chest xray prior to your procedure.  Our scheduler will advise you on when these items need to be done.       Reps: Arturo Morton Left groin stick

## 2012-12-01 NOTE — Assessment & Plan Note (Signed)
Patient complains of nocturnal bilateral leg pain which is improved with daily his legs over the bed. He really denies claudication. I suspect this may be a manifestation of severe arterial deficiency with Dopplers that revealed ABIs in the 0.5-0.6 range bilaterally with high grade distal SFA and tibial vessel disease. Based on this we decided to proceed with angiography and potential percutaneous intervention of his right SFA first and if successful the left in a staged fashion.

## 2012-12-01 NOTE — Progress Notes (Signed)
12/01/2012 Danny Buck Sr.   [birth year]  [id number]  Primary Physician Denita Lung, DO Primary Cardiologist: [name] Roseanne Reno   HPI:  Danny Buck is an 79 year old Afro-American male patient of Dr. Dr. Erin Hearing is is referred for evaluation of claudication/critical limb ischemia. He has a history of complete complete heart block and subsequent pacemaker insertion as well as essential hypertension. He had legitimate Dopplers performed in our office 08/14/12 revealing ABI 0.5-0.6 range with high-grade SFA and tibial vessel disease. He denies chest pain or shortness of breath. While he does not complain of significant claudication he does complain of nocturnal leg pain while recumbent which improved when he puts his legs over the side of the bed which aren't typical for critical limb ischemia. After long discussion with the patient's daughters it was decided to proceed with angiography and potential percutaneous intervention.   Current Outpatient Prescriptions  Medication Sig Dispense Refill  . aspirin EC 81 MG tablet Take 81 mg by mouth daily.      . ferrous sulfate 325 (65 FE) MG tablet Take 325 mg by mouth daily with breakfast.      . lisinopril-hydrochlorothiazide (PRINZIDE,ZESTORETIC) 20-25 MG per tablet Take 20-25 tablets by mouth daily.       No current facility-administered medications for this visit.    No Known Allergies  History   Social History  . Marital Status: Widowed    Spouse Name: N/A    Number of Children: 8  . Years of Education: N/A   Occupational History  .     Social History Main Topics  . Smoking status: Former Games developer  . Smokeless tobacco: Former Neurosurgeon    Quit date: 05/03/1963     Comment: quit in 1964  . Alcohol Use: No  . Drug Use: No  . Sexually Active: Not on file   Other Topics Concern  . Not on file   Social History Narrative  . No narrative on file     Review of Systems: General: negative for chills,  fever, night sweats or weight changes.  Cardiovascular: negative for chest pain, dyspnea on exertion, edema, orthopnea, palpitations, paroxysmal nocturnal dyspnea or shortness of breath Dermatological: negative for rash Respiratory: negative for cough or wheezing Urologic: negative for hematuria Abdominal: negative for nausea, vomiting, diarrhea, bright red blood per rectum, melena, or hematemesis Neurologic: negative for visual changes, syncope, or dizziness All other systems reviewed and are otherwise negative except as noted above.    Blood pressure 140/68, pulse 82, height 5\' 11"  (1.803 m), weight 197 lb 14.4 oz (89.767 kg).  General appearance: alert and no distress Neck: no adenopathy, no carotid bruit, no JVD, supple, symmetrical, trachea midline and thyroid not enlarged, symmetric, no tenderness/mass/nodules Lungs: clear to auscultation bilaterally Heart: normal apical impulse Extremities: extremities normal, atraumatic, no cyanosis or edema and absent pedal pulses bilaterally  EKG not performed today  ASSESSMENT AND PLAN:   PAD (peripheral artery disease) Patient complains of nocturnal bilateral leg pain which is improved with daily his legs over the bed. He really denies claudication. I suspect this may be a manifestation of severe arterial deficiency with Dopplers that revealed ABIs in the 0.5-0.6 range bilaterally with high grade distal SFA and tibial vessel disease. Based on this we decided to proceed with angiography and potential percutaneous intervention of his right SFA first and if successful the left in a staged fashion.      [name] FACP,FACC,FAHA, [facility] 12/01/2012 3:32 PM

## 2012-12-04 ENCOUNTER — Encounter (HOSPITAL_COMMUNITY): Payer: Self-pay | Admitting: Pharmacy Technician

## 2012-12-05 ENCOUNTER — Ambulatory Visit
Admission: RE | Admit: 2012-12-05 | Discharge: 2012-12-05 | Disposition: A | Discharge status: Home / Self Care | Payer: Medicare Other | Source: Ambulatory Visit | Attending: Cardiovascular Disease | Admitting: Cardiovascular Disease

## 2012-12-05 DIAGNOSIS — Z720 Tobacco use: Secondary | ICD-10-CM

## 2012-12-05 LAB — CBC
HCT: 33.9 % — ABNORMAL LOW (ref 39.0–52.0)
MCHC: 33.9 g/dL (ref 30.0–36.0)
Platelets: 236 10*3/uL (ref 150–400)
RDW: 15.9 % — ABNORMAL HIGH (ref 11.5–15.5)
WBC: 3.9 10*3/uL — ABNORMAL LOW (ref 4.0–10.5)

## 2012-12-05 LAB — BASIC METABOLIC PANEL
BUN: 24 mg/dL — ABNORMAL HIGH (ref 6–23)
CO2: 30 mEq/L (ref 19–32)
Calcium: 8.8 mg/dL (ref 8.4–10.5)
Glucose, Bld: 98 mg/dL (ref 70–99)
Sodium: 139 mEq/L (ref 135–145)

## 2012-12-05 LAB — PROTIME-INR
INR: 1.05 (ref <1.50)
Prothrombin Time: 13.7 seconds (ref 11.6–15.2)

## 2012-12-08 ENCOUNTER — Telehealth: Payer: Self-pay | Admitting: Cardiovascular Disease

## 2012-12-08 NOTE — Telephone Encounter (Signed)
Spoke with Chip Boer.  I advised her of the plan to admit Mr Fleet on Monday for hydration prior to his procedure on Tues.

## 2012-12-08 NOTE — Telephone Encounter (Signed)
Returning your call from yesterday. °

## 2012-12-10 HISTORY — PX: FEMORAL ARTERY STENT: SHX1583

## 2012-12-11 ENCOUNTER — Observation Stay (HOSPITAL_COMMUNITY)
Admission: AD | Admit: 2012-12-11 | Discharge: 2012-12-13 | Disposition: A | Discharge status: Home / Self Care | Payer: Medicare Other | Source: Ambulatory Visit | Attending: Cardiovascular Disease | Admitting: Cardiovascular Disease

## 2012-12-11 ENCOUNTER — Encounter (HOSPITAL_COMMUNITY): Payer: Self-pay | Admitting: General Practice

## 2012-12-11 DIAGNOSIS — I739 Peripheral vascular disease, unspecified: Secondary | ICD-10-CM

## 2012-12-11 DIAGNOSIS — I442 Atrioventricular block, complete: Secondary | ICD-10-CM

## 2012-12-11 DIAGNOSIS — Z01818 Encounter for other preprocedural examination: Secondary | ICD-10-CM

## 2012-12-11 DIAGNOSIS — Z79899 Other long term (current) drug therapy: Secondary | ICD-10-CM | POA: Insufficient documentation

## 2012-12-11 DIAGNOSIS — R0609 Other forms of dyspnea: Secondary | ICD-10-CM

## 2012-12-11 DIAGNOSIS — I10 Essential (primary) hypertension: Secondary | ICD-10-CM

## 2012-12-11 DIAGNOSIS — I70219 Atherosclerosis of native arteries of extremities with intermittent claudication, unspecified extremity: Principal | ICD-10-CM | POA: Insufficient documentation

## 2012-12-11 HISTORY — DX: Unspecified osteoarthritis, unspecified site: M19.90

## 2012-12-11 LAB — BASIC METABOLIC PANEL
BUN: 22 mg/dL (ref 6–23)
Chloride: 103 mEq/L (ref 96–112)
GFR calc Af Amer: 50 mL/min — ABNORMAL LOW (ref >90)
GFR calc non Af Amer: 43 mL/min — ABNORMAL LOW (ref >90)
Potassium: 4.3 mEq/L (ref 3.5–5.1)
Sodium: 138 mEq/L (ref 135–145)

## 2012-12-11 LAB — CBC
HCT: 30.5 % — ABNORMAL LOW (ref 39.0–52.0)
Hemoglobin: 10.3 g/dL — ABNORMAL LOW (ref 13.0–17.0)
RBC: 4.05 MIL/uL — ABNORMAL LOW (ref 4.22–5.81)
WBC: 3.8 10*3/uL — ABNORMAL LOW (ref 4.0–10.5)

## 2012-12-11 MED ORDER — ASPIRIN 81 MG PO CHEW
324.0000 mg | CHEWABLE_TABLET | ORAL | Status: AC
  Filled 2012-12-11: qty 4

## 2012-12-11 MED ORDER — DIAZEPAM 5 MG PO TABS
5.0000 mg | ORAL_TABLET | ORAL | Status: AC
  Administered 2012-12-12: 5 mg via ORAL
  Filled 2012-12-11: qty 1

## 2012-12-11 MED ORDER — SODIUM CHLORIDE 0.9 % IJ SOLN
3.0000 mL | Freq: Two times a day (BID) | INTRAMUSCULAR | Status: DC
  Administered 2012-12-11 – 2012-12-12 (×2): 3 mL via INTRAVENOUS

## 2012-12-11 MED ORDER — SODIUM CHLORIDE 0.9 % IV SOLN: 250.0000 mL | INTRAVENOUS | Status: DC | PRN

## 2012-12-11 MED ORDER — FERROUS SULFATE 325 (65 FE) MG PO TABS
325.0000 mg | ORAL_TABLET | Freq: Every day | ORAL | Status: DC
  Administered 2012-12-13: 325 mg via ORAL
  Filled 2012-12-11 (×3): qty 1

## 2012-12-11 MED ORDER — ASPIRIN 81 MG PO CHEW
324.0000 mg | CHEWABLE_TABLET | ORAL | Status: AC
  Administered 2012-12-12: 324 mg via ORAL

## 2012-12-11 MED ORDER — SODIUM CHLORIDE 0.9 % IJ SOLN: 3.0000 mL | INTRAMUSCULAR | Status: DC | PRN

## 2012-12-11 MED ORDER — ASPIRIN EC 81 MG PO TBEC
81.0000 mg | DELAYED_RELEASE_TABLET | Freq: Every day | ORAL | Status: DC
  Filled 2012-12-11 (×3): qty 1

## 2012-12-11 MED ORDER — ACETAMINOPHEN 325 MG PO TABS: 650.0000 mg | ORAL_TABLET | ORAL | Status: DC | PRN

## 2012-12-11 MED ORDER — ONDANSETRON HCL 4 MG/2ML IJ SOLN: 4.0000 mg | Freq: Four times a day (QID) | INTRAMUSCULAR | Status: DC | PRN

## 2012-12-11 MED ORDER — SODIUM CHLORIDE 0.9 % IV SOLN
INTRAVENOUS | Status: DC
  Administered 2012-12-11 – 2012-12-12 (×2): via INTRAVENOUS

## 2012-12-11 MED ORDER — SODIUM CHLORIDE 0.9 % IV SOLN: INTRAVENOUS | Status: DC

## 2012-12-11 NOTE — H&P (Signed)
Patient decided to take a proxy form for My Chart after delete button was documented . Proxy form given to family per patient request.

## 2012-12-11 NOTE — Progress Notes (Signed)
[facility]  Pt is admitted for pre cath hydration for PV angio tomorrow. See office H&P from 12/01/12 by Dr. Allyson Sabal.  Subjective: Endorses nocturnal resting bilateral lower extremity claudication. Denies recent chest pain, SOB, n/v, fever or chills.   Objective: Vital signs in last 24 hours: Temp:  [97.7 F (36.5 C)] 97.7 F (36.5 C) (08/11 1157) Pulse Rate:  [66] 66 (08/11 1157) BP: (136)/(63) 136/63 mmHg (08/11 1157) SpO2:  [100 %] 100 % (08/11 1157) Last BM Date: 12/11/12  Intake/Output from previous day:   Intake/Output this shift:    Medications Current Facility-Administered Medications  Medication Dose Route Frequency Provider Last Rate Last Dose  . 0.9 %  sodium chloride infusion  250 mL Intravenous PRN Brittainy Simmons, PA-C      . 0.9 %  sodium chloride infusion   Intravenous Continuous Brittainy Simmons, PA-C      . acetaminophen (TYLENOL) tablet 650 mg  650 mg Oral Q4H PRN Brittainy Simmons, PA-C      . [START ON 12/12/2012] aspirin chewable tablet 324 mg  324 mg Oral Pre-Cath Brittainy Simmons, PA-C      . aspirin EC tablet 81 mg  81 mg Oral Daily Brittainy Simmons, PA-C      . [START ON 12/12/2012] ferrous sulfate tablet 325 mg  325 mg Oral Q breakfast Brittainy Simmons, PA-C      . ondansetron (ZOFRAN) injection 4 mg  4 mg Intravenous Q6H PRN Brittainy Simmons, PA-C      . sodium chloride 0.9 % injection 3 mL  3 mL Intravenous Q12H Brittainy Simmons, PA-C      . sodium chloride 0.9 % injection 3 mL  3 mL Intravenous PRN Robbie Lis, PA-C        PE: General appearance: alert, cooperative and no distress Lungs: clear to auscultation bilaterally Heart: regular rate and rhythm Extremities: no LEE Pulses: 2+ radials, absent DPs bilaterally Skin: warm and dry Neurologic: Grossly normal   Assessment/Plan  Active Problems:   PAD (peripheral artery disease)   Essential hypertension  Plan: Pt admitted for precath hydration for  scheduled PV angio tomorrow with Dr. Allyson Sabal to evaluate lower extremity vasculature, in the setting of bilateral claudication. Will start hydration with IVFs now. Labs are pending. Will reassess renal function in the am and will check INR. Will hold lisinopril/HCTZ in anticipation of cath. Will make NPO starting at midnight. Dr. Allyson Sabal to see in the AM.     LOS: 0 days    Brittainy M. Delmer Islam 12/11/2012 12:36 PM  Agree with note written by Boyce Medici  PAC  Agree. Admitted for hydration prior to [facility] angio tomorrow. Check labs in AM.  Runell Gess 12/11/2012 4:36 PM

## 2012-12-12 ENCOUNTER — Encounter (HOSPITAL_COMMUNITY)
Admission: AD | Disposition: A | Discharge status: Home / Self Care | Payer: Self-pay | Source: Ambulatory Visit | Attending: Cardiovascular Disease

## 2012-12-12 DIAGNOSIS — I70219 Atherosclerosis of native arteries of extremities with intermittent claudication, unspecified extremity: Secondary | ICD-10-CM

## 2012-12-12 DIAGNOSIS — I739 Peripheral vascular disease, unspecified: Secondary | ICD-10-CM

## 2012-12-12 HISTORY — PX: PERCUTANEOUS STENT INTERVENTION: SHX5500

## 2012-12-12 HISTORY — PX: ABDOMINAL AORTAGRAM: SHX5454

## 2012-12-12 HISTORY — PX: LOWER EXTREMITY ANGIOGRAM: SHX5508

## 2012-12-12 LAB — PROTIME-INR: Prothrombin Time: 14.4 seconds (ref 11.6–15.2)

## 2012-12-12 LAB — CBC
HCT: 29.6 % — ABNORMAL LOW (ref 39.0–52.0)
Hemoglobin: 10 g/dL — ABNORMAL LOW (ref 13.0–17.0)
MCH: 25.6 pg — ABNORMAL LOW (ref 26.0–34.0)
MCHC: 33.8 g/dL (ref 30.0–36.0)
MCV: 75.9 fL — ABNORMAL LOW (ref 78.0–100.0)
RBC: 3.9 MIL/uL — ABNORMAL LOW (ref 4.22–5.81)

## 2012-12-12 LAB — BASIC METABOLIC PANEL
BUN: 18 mg/dL (ref 6–23)
CO2: 29 mEq/L (ref 19–32)
Chloride: 105 mEq/L (ref 96–112)
GFR calc non Af Amer: 52 mL/min — ABNORMAL LOW (ref >90)
Glucose, Bld: 108 mg/dL — ABNORMAL HIGH (ref 70–99)
Potassium: 3.9 mEq/L (ref 3.5–5.1)
Sodium: 140 mEq/L (ref 135–145)

## 2012-12-12 LAB — POCT ACTIVATED CLOTTING TIME: Activated Clotting Time: 186 seconds

## 2012-12-12 SURGERY — ANGIOGRAM, LOWER EXTREMITY
Anesthesia: LOCAL | Laterality: Right

## 2012-12-12 MED ORDER — MORPHINE SULFATE 2 MG/ML IJ SOLN
1.0000 mg | INTRAMUSCULAR | Status: DC | PRN
  Administered 2012-12-12: 1 mg via INTRAVENOUS
  Filled 2012-12-12: qty 1

## 2012-12-12 MED ORDER — ACETAMINOPHEN 325 MG PO TABS: 650.0000 mg | ORAL_TABLET | ORAL | Status: DC | PRN

## 2012-12-12 MED ORDER — NITROGLYCERIN 0.2 MG/ML ON CALL CATH LAB
INTRAVENOUS | Status: AC
  Filled 2012-12-12: qty 2

## 2012-12-12 MED ORDER — SODIUM CHLORIDE 0.9 % IV SOLN: INTRAVENOUS | Status: AC

## 2012-12-12 MED ORDER — VERAPAMIL HCL 2.5 MG/ML IV SOLN
INTRAVENOUS | Status: AC
  Filled 2012-12-12: qty 2

## 2012-12-12 MED ORDER — NITROGLYCERIN 0.2 MG/ML ON CALL CATH LAB
INTRAVENOUS | Status: AC
  Filled 2012-12-12: qty 20

## 2012-12-12 MED ORDER — HEPARIN SODIUM (PORCINE) 1000 UNIT/ML IJ SOLN
INTRAMUSCULAR | Status: AC
  Filled 2012-12-12: qty 1

## 2012-12-12 MED ORDER — ASPIRIN EC 325 MG PO TBEC
325.0000 mg | DELAYED_RELEASE_TABLET | Freq: Every day | ORAL | Status: DC
  Administered 2012-12-13: 325 mg via ORAL
  Filled 2012-12-12 (×2): qty 1

## 2012-12-12 MED ORDER — ONDANSETRON HCL 4 MG/2ML IJ SOLN: 4.0000 mg | Freq: Four times a day (QID) | INTRAMUSCULAR | Status: DC | PRN

## 2012-12-12 MED ORDER — CLOPIDOGREL BISULFATE 75 MG PO TABS
75.0000 mg | ORAL_TABLET | Freq: Every day | ORAL | Status: DC
  Administered 2012-12-13: 75 mg via ORAL
  Filled 2012-12-12: qty 1

## 2012-12-12 MED ORDER — HEPARIN (PORCINE) IN NACL 2-0.9 UNIT/ML-% IJ SOLN
INTRAMUSCULAR | Status: AC
  Filled 2012-12-12: qty 1000

## 2012-12-12 MED ORDER — HYDRALAZINE HCL 20 MG/ML IJ SOLN
10.0000 mg | INTRAMUSCULAR | Status: DC | PRN
  Administered 2012-12-12 (×2): 10 mg via INTRAVENOUS
  Filled 2012-12-12 (×2): qty 1

## 2012-12-12 MED ORDER — CLOPIDOGREL BISULFATE 75 MG PO TABS
ORAL_TABLET | ORAL | Status: AC
  Filled 2012-12-12: qty 4

## 2012-12-12 MED ORDER — LIDOCAINE HCL (PF) 1 % IJ SOLN
INTRAMUSCULAR | Status: AC
  Filled 2012-12-12: qty 30

## 2012-12-12 MED ORDER — HEPARIN (PORCINE) IN NACL 2-0.9 UNIT/ML-% IJ SOLN
INTRAMUSCULAR | Status: AC
  Filled 2012-12-12: qty 500

## 2012-12-12 NOTE — Progress Notes (Signed)
UR Completed Milbert Bixler Graves-Bigelow, RN,BSN [phone]  

## 2012-12-12 NOTE — Progress Notes (Signed)
Subjective:  No CP/SOB  Objective:  Temp:  [97.4 F (36.3 C)-97.7 F (36.5 C)] 97.4 F (36.3 C) (08/12 0513) Pulse Rate:  [60-66] 60 (08/12 0513) Resp:  [18] 18 (08/12 0513) BP: (136-155)/(63-79) 151/79 mmHg (08/12 0513) SpO2:  [100 %] 100 % (08/12 0513) Weight:  [196 lb 6.4 oz (89.086 kg)] 196 lb 6.4 oz (89.086 kg) (08/12 0513) Weight change:   Intake/Output from previous day: 08/11 0701 - 08/12 0700 In: 1262.5 [P.O.:240; I.V.:1022.5] Out: 1025 [Urine:1025]  Intake/Output from this shift:    Physical Exam: General appearance: alert and no distress Neck: no adenopathy, no carotid bruit, no JVD, supple, symmetrical, trachea midline and thyroid not enlarged, symmetric, no tenderness/mass/nodules Lungs: clear to auscultation bilaterally Heart: regular rate and rhythm, S1, S2 normal, no murmur, click, rub or gallop Extremities: extremities normal, atraumatic, no cyanosis or edema  Lab Results: Results for orders placed during the hospital encounter of 12/11/12 (from the past 48 hour(s))  BASIC METABOLIC PANEL     Status: Abnormal   Collection Time    12/11/12 12:46 PM      Result Value Range   Sodium 138  135 - 145 mEq/L   Potassium 4.3  3.5 - 5.1 mEq/L   Chloride 103  96 - 112 mEq/L   CO2 27  19 - 32 mEq/L   Glucose, Bld 99  70 - 99 mg/dL   BUN 22  6 - 23 mg/dL   Creatinine, Ser 6.57 (*) 0.50 - 1.35 mg/dL   Calcium 8.9  8.4 - 84.6 mg/dL   GFR calc non Af Amer 43 (*) >90 mL/min   GFR calc Af Amer 50 (*) >90 mL/min   Comment:            The eGFR has been calculated     using the CKD EPI equation.     This calculation has not been     validated in all clinical     situations.     eGFR's persistently     <90 mL/min signify     possible Chronic Kidney Disease.  CBC     Status: Abnormal   Collection Time    12/11/12 12:46 PM      Result Value Range   WBC 3.8 (*) 4.0 - 10.5 K/uL   RBC 4.05 (*) 4.22 - 5.81 MIL/uL   Hemoglobin 10.3 (*) 13.0 - 17.0 g/dL   HCT  96.2 (*) 95.2 - 52.0 %   MCV 75.3 (*) 78.0 - 100.0 fL   MCH 25.4 (*) 26.0 - 34.0 pg   MCHC 33.8  30.0 - 36.0 g/dL   RDW 84.1  32.4 - 40.1 %   Platelets 187  150 - 400 K/uL  BASIC METABOLIC PANEL     Status: Abnormal   Collection Time    12/12/12  4:30 AM      Result Value Range   Sodium 140  135 - 145 mEq/L   Potassium 3.9  3.5 - 5.1 mEq/L   Chloride 105  96 - 112 mEq/L   CO2 29  19 - 32 mEq/L   Glucose, Bld 108 (*) 70 - 99 mg/dL   BUN 18  6 - 23 mg/dL   Creatinine, Ser 0.27  0.50 - 1.35 mg/dL   Calcium 8.5  8.4 - 25.3 mg/dL   GFR calc non Af Amer 52 (*) >90 mL/min   GFR calc Af Amer 60 (*) >90 mL/min   Comment:  The eGFR has been calculated     using the CKD EPI equation.     This calculation has not been     validated in all clinical     situations.     eGFR's persistently     <90 mL/min signify     possible Chronic Kidney Disease.  CBC     Status: Abnormal   Collection Time    12/12/12  4:30 AM      Result Value Range   WBC 3.1 (*) 4.0 - 10.5 K/uL   RBC 3.90 (*) 4.22 - 5.81 MIL/uL   Hemoglobin 10.0 (*) 13.0 - 17.0 g/dL   HCT 16.1 (*) 09.6 - 04.5 %   MCV 75.9 (*) 78.0 - 100.0 fL   MCH 25.6 (*) 26.0 - 34.0 pg   MCHC 33.8  30.0 - 36.0 g/dL   RDW 40.9  81.1 - 91.4 %   Platelets 181  150 - 400 K/uL  PROTIME-INR     Status: None   Collection Time    12/12/12  4:30 AM      Result Value Range   Prothrombin Time 14.4  11.6 - 15.2 seconds   INR 1.14  0.00 - 1.49    Imaging: Imaging results have been reviewed  Assessment/Plan:   1. Active Problems: 2.   PAD (peripheral artery disease) 3.   Essential hypertension 4.   Time Spent Directly with Patient:  20 minutes  Length of Stay:  LOS: 1 day   For PV angio today. PT admitted yesterday for hydration secondary to mod RI. SCr has improved from 1.4 to 1.2. Exam benign. RLE more symptomatic then left. Doppler shows high grade distal SFA disease bilaterally with 1 vessel R/O . Will access Left groin and  perform bifem R/O.  Runell Gess 12/12/2012, 9:11 AM

## 2012-12-12 NOTE — CV Procedure (Signed)
Lamarr Feenstra is a 79 y.o. male    [id number] LOCATION:  FACILITY: [facility]  PHYSICIAN: Nanetta Batty, M.D. 04-04-1922   DATE OF PROCEDURE:  12/12/2012  DATE OF DISCHARGE:   CARDIAC CATHETERIZATION     History obtained from chart review.Mr. Melchior is an 79 year old gentleman patient of Dr. Royann Shivers with a history of claudication. Doppler suggested high-grade SFA disease with one vessel runoff. He was admitted to medical floor for hydration yesterday with improvement in his serum creatinine from 1.4-1.2. He presents now for angiography and potential percutaneous intervention for lifestyle limiting claudication.   PROCEDURE DESCRIPTION:    The patient was brought to the second floor [facility] cath lab in the postabsorptive state. He was not premedicated . His left groinwas prepped and shaved in usual sterile fashion. Xylocaine 1% was used for local anesthesia. A 5 French sheath was inserted into the left common femoral artery using standard Seldinger technique. A 5 French pigtail catheter was used for midstream abdominal aortography, distal abdominal aortography with bifemoral runoff using digital subtraction bolus chase step table technique. Visipaque dye was used for the entirety of the case (267 cc delivered the patient), retrograde aortic pressure was monitored during the case  HEMODYNAMICS:    AO SYSTOLIC/AO DIASTOLIC: 159/63    ANGIOGRAPHIC RESULTS:   1: Abdominal aortogram-renal arteries are widely patent. The infrarenal abdominal aorta was free of significant atherosclerotic changes.  2: Left lower extremity-95% segmental mid calcified left SFA stenosis with one vessel runoff via the peroneal  3: Right lower extremity-99% segmental calcified mid right SFA stenosis, 70% right popliteal stenosis with one vessel runoff via the peroneal  IMPRESSION:Mr. Cartlidge has high grade segmental calcified mid SFA stenoses bilaterally with a 70% right popliteal stenosis and  one-vessel runoff bilaterally. Will proceed with diamondback orbital rotational atherectomy plus or minus PTA and/or stenting with IDEV  Stent.  Procedure description: Contralateral access was obtained with a 5 [place] crossover catheter, 035 glide wire, a 035 Rosen wire, and a 7 French/55 cm Ansel sheath. The patient received 7500 units of heparin with an ACT of 237. The disease segment was crossed with an 014 Rigali wire through a 18 cross endhole catheter. This was placed in the below-the-knee popliteal artery and exchanged for an 014 viper wire. Following this a small NAV 6 distal protection device was then deployed in the below-the-knee popliteal artery because the patient had one-vessel runoff. Diamondback orbital rotational atherectomy was performed with a 1.5 mm bur at up to 120,000 RPMs. Following this balloon angioplasty was performed with a 6 mm x 4 cm chocolate balloon followed by a 7 mm x 8 cm angioplasty balloon in preparation for to pulling a 6.5 mm x 100 mm long IDEV  Stent. The right popliteal artery with an angioplasty with a 6 mm x 4 cm chocolate balloon as was the proximal SFA resulting in reduction of a 99% calcified segmental mid right SFA stenosis to 0% residual and a 70% right popliteal stenosis to 0% residual with excellent brisk flow, one-vessel runoff via a. All artery there collateralized the anterior and posterior tibial arteries at the level of the ankle. The distal protection device was then captured and withdrawn from the body. The sheath was withdrawn across the bifurcation and exchanged over an 035 Versicore wire for a short 7 [place] sheath. The patient received 300 mg of by mouth Plavix.  Final impression: Successful diamondback orbital rotational atherectomy, PTA with chocolate balloon and stenting with IDEV stent of high grade  segmental mid calcified right SFA stenosis as well as right popliteal stenosis for lifestyle limiting claudication. The patient does have similar  anatomy on the left which will need to be addressed in a staged fashion. The sheath will be removed once the ACT falls below 170 and pressure will be held on the groin to achieve hemostasis. The patient will be treated with aspirin Plavix, hydrated overnight and discharged home in the morning. He will get followup Dopplers and ABIs and will see me back after that.  [name], [facility] 12/12/2012 5:22 PM

## 2012-12-12 NOTE — H&P (Signed)
.    Pt was reexamined and existing H & P reviewed. No changes found.  Runell Gess, MD [facility] 12/12/2012 3:34 PM

## 2012-12-12 NOTE — Care Management Note (Addendum)
    Page 1 of 1   12/12/2012     12:03:05 PM   CARE MANAGEMENT NOTE 12/12/2012  Patient:  Danny Buck, Danny Buck   Account Number:  [id number]  Date Initiated:  12/12/2012  Documentation initiated by:  GRAVES-BIGELOW,Cecilio Ohlrich  Subjective/Objective Assessment:   Pt admitted for hydration secondary to mod RI. For PV angio today. Plan for d/c home in am.     Action/Plan:   No needs from CM  at this time.   Anticipated DC Date:  12/13/2012   Anticipated DC Plan:  HOME/SELF CARE      DC Planning Services  CM consult      Choice offered to / List presented to:             Status of service:  Completed, signed off Medicare Important Message given?   (If response is "NO", the following Medicare IM given date fields will be blank) Date Medicare IM given:   Date Additional Medicare IM given:    Discharge Disposition:  HOME/SELF CARE  Per UR Regulation:  Reviewed for med. necessity/level of care/duration of stay  If discussed at Long Length of Stay Meetings, dates discussed:    Comments:

## 2012-12-13 ENCOUNTER — Telehealth: Payer: Self-pay | Admitting: Cardiovascular Disease

## 2012-12-13 ENCOUNTER — Other Ambulatory Visit: Payer: Self-pay | Admitting: Physician Assistant

## 2012-12-13 DIAGNOSIS — I739 Peripheral vascular disease, unspecified: Secondary | ICD-10-CM

## 2012-12-13 MED ORDER — CLOPIDOGREL BISULFATE 75 MG PO TABS: 75.0000 mg | ORAL_TABLET | Freq: Every day | ORAL | Status: DC

## 2012-12-13 MED ORDER — ASPIRIN 325 MG PO TABS
ORAL_TABLET | ORAL | Status: AC
  Filled 2012-12-13: qty 1

## 2012-12-13 NOTE — Progress Notes (Signed)
Pt. Seen and examined. Agree with the NP/PA-C note as written.  Successful diamondback atherectomy and stenting yesterday. No complaints of leg pain or groin site complications. He can be discharged today. I'm happy to see him in follow-up (he is my patient, but Dr. Salena Saner has seen him for his pacemaker).  Chrystie Nose, MD, [facility] Attending Cardiologist [facility]

## 2012-12-13 NOTE — Telephone Encounter (Signed)
Taken care of

## 2012-12-13 NOTE — Telephone Encounter (Signed)
Has a question about her father bp medication . Mr. Tietje was just released from the hospital . ... Please call  Thanks

## 2012-12-13 NOTE — Progress Notes (Signed)
Sheath pulled at 2152 last night. Left groin prior to sheath pull is level 0 but is slightly oozing. Pressure held for 20 mins. Pt has been stable throughout the procedure. Instructions given. Pressure dressing applied. Lt groin post sheath pull Level 0. Will continue to monitor.

## 2012-12-13 NOTE — Progress Notes (Signed)
Pt education complete; daughter at bedside; PIV d/c; pt aware of when to call 911; belongings - dentures & clothing returned to pt & daughter; emotional support given

## 2012-12-13 NOTE — Telephone Encounter (Signed)
Returned call and spoke w/ pt's daughter, Larene Beach.  Stated pt was discharged today and told not to take his BP med for 2 weeks.  Wanted to know if that is okay or if pt should be on something.  Daughter informed RN is not able to see a discharge summary and pt should follow instructions given per MD at discharge.  Verbalized understanding.  Informed Dr. Rennis Golden and Judie Grieve, PA-C will be notified as RN is not aware of instructions.  Also advised pt keep f/u appt as scheduled.  Verbalized understanding and agreed w/ plan.  Message forwarded to Dr. Arville Go, PA-C.

## 2012-12-13 NOTE — Progress Notes (Signed)
    Subjective: No complaints.  Objective: Vital signs in last 24 hours: Temp:  [97.4 F (36.3 C)-98.2 F (36.8 C)] 98.2 F (36.8 C) (08/13 0756) Pulse Rate:  [58-73] 63 (08/13 0346) Resp:  [11-22] 14 (08/13 0600) BP: (103-173)/(39-121) 123/57 mmHg (08/13 0600) SpO2:  [98 %-100 %] 98 % (08/13 0756) Last BM Date: 12/11/12  Intake/Output from previous day: 08/12 0701 - 08/13 0700 In: 1218 [P.O.:240; I.V.:978] Out: 1700 [Urine:1700] Intake/Output this shift: Total I/O In: -  Out: 200 [Urine:200]  Medications Current Facility-Administered Medications  Medication Dose Route Frequency Provider Last Rate Last Dose  . acetaminophen (TYLENOL) tablet 650 mg  650 mg Oral Q4H PRN Runell Gess, MD      . aspirin EC tablet 325 mg  325 mg Oral Daily Runell Gess, MD      . clopidogrel (PLAVIX) tablet 75 mg  75 mg Oral Q breakfast Runell Gess, MD   75 mg at 12/13/12 0852  . ferrous sulfate tablet 325 mg  325 mg Oral Q breakfast Brittainy Simmons, PA-C   325 mg at 12/13/12 0981  . hydrALAZINE (APRESOLINE) injection 10 mg  10 mg Intravenous Q4H PRN Runell Gess, MD   10 mg at 12/12/12 2143  . morphine 2 MG/ML injection 1 mg  1 mg Intravenous Q1H PRN Runell Gess, MD   1 mg at 12/12/12 2131  . ondansetron (ZOFRAN) injection 4 mg  4 mg Intravenous Q6H PRN Runell Gess, MD        PE: General appearance: alert, cooperative and no distress Lungs: clear to auscultation bilaterally Heart: regular rate and rhythm, S1, S2 normal, no murmur, click, rub or gallop Extremities: 1+ LEE Pulses: 2+ and symmetric 1+ DPs and PTs.  Skin: Right groin cath site without hematoma.  nontender.   Neurologic: Grossly normal  Lab Results:   Recent Labs  12/11/12 1246 12/12/12 0430  WBC 3.8* 3.1*  HGB 10.3* 10.0*  HCT 30.5* 29.6*  PLT 187 181   BMET  Recent Labs  12/11/12 1246 12/12/12 0430  NA 138 140  K 4.3 3.9  CL 103 105  CO2 27 29  GLUCOSE 99 108*  BUN 22 18    CREATININE 1.41* 1.21  CALCIUM 8.9 8.5   PT/INR  Recent Labs  12/12/12 0430  LABPROT 14.4  INR 1.14    Assessment/Plan  Active Problems:   PAD (peripheral artery disease)   Essential hypertension  Plan:  SP PV angiogram revealing high-grade segmental calcified mid SFA stenoses bilaterally with a 70% right popliteal stenosis and one-vessel runoff bilaterally.  Successful diamondback orbital rotational atherectomy, PTA with chocolate balloon and stenting with IDEV stent of high grade segmental mid calcified right SFA stenosis as well as right popliteal stenosis.   1+ distal pulses. ASA and plavix.  LEA dopplers in two-three weeks then FU with Dr. Allyson Sabal.     LOS: 2 days    Kean Gautreau 12/13/2012 9:31 AM

## 2012-12-13 NOTE — Plan of Care (Signed)
Problem: Phase I Progression Outcomes Goal: Vascular site scale level 0 - I Vascular Site Scale Level 0: No bruising/bleeding/hematoma Level I (Mild): Bruising/Ecchymosis, minimal bleeding/ooozing, palpable hematoma < 3 cm Level II (Moderate): Bleeding not affecting hemodynamic parameters, pseudoaneurysm, palpable hematoma > 3 cm Level III (Severe) Bleeding which affects hemodynamic parameters or retroperitoneal hemorrhage  Outcome: Completed/Met Date Met:  12/13/12 Level 0 lt groin

## 2012-12-14 NOTE — Discharge Summary (Signed)
Physician Discharge Summary  Patient ID: Danny Buck MRN: [id number] DOB/AGE: [birth year] 79 y.o.  Admit date: 12/11/2012 Discharge date: 12/14/2012  Admission Diagnoses:  PAD  Discharge Diagnoses:  Active Problems:   PAD (peripheral artery disease)   Essential hypertension   Discharged Condition: stable  Hospital Course:   Danny Buck is an 79 year old gentleman patient of Dr. Royann Shivers with a history of claudication. Doppler suggested high-grade SFA disease with one vessel runoff. He was admitted to medical floor for hydration yesterday with improvement in his serum creatinine from 1.4-1.2. He presented for angiography and potential percutaneous intervention for lifestyle limiting claudication.  He underwent successful diamondback orbital rotational atherectomy, PTA with chocolate balloon and stenting with IDEV stent of high grade segmental mid calcified right SFA stenosis as well as right popliteal stenosis for lifestyle limiting claudication. The patient does have similar anatomy on the left which will need to be addressed in a staged fashion. Patient will have followup lower extremity arterial Dopplers and see Dr. Allyson Sabal in the office.  The patient was seen by Dr. Rennis Golden who felt he was stable for DC home.     Consults: None  Significant Diagnostic Studies:  HEMODYNAMICS:  AO SYSTOLIC/AO DIASTOLIC: 159/63  ANGIOGRAPHIC RESULTS:  1: Abdominal aortogram-renal arteries are widely patent. The infrarenal abdominal aorta was free of significant atherosclerotic changes.  2: Left lower extremity-95% segmental mid calcified left SFA stenosis with one vessel runoff via the peroneal  3: Right lower extremity-99% segmental calcified mid right SFA stenosis, 70% right popliteal stenosis with one vessel runoff via the peroneal  IMPRESSION:Danny Buck has high grade segmental calcified mid SFA stenoses bilaterally with a 70% right popliteal stenosis and one-vessel runoff bilaterally. Will  proceed with diamondback orbital rotational atherectomy plus or minus PTA and/or stenting with IDEV Stent.  Procedure description: Contralateral access was obtained with a 5 [place] crossover catheter, 035 glide wire, a 035 Rosen wire, and a 7 French/55 cm Ansel sheath. The patient received 7500 units of heparin with an ACT of 237. The disease segment was crossed with an 014 Rigali wire through a 18 cross endhole catheter. This was placed in the below-the-knee popliteal artery and exchanged for an 014 viper wire. Following this a small NAV 6 distal protection device was then deployed in the below-the-knee popliteal artery because the patient had one-vessel runoff. Diamondback orbital rotational atherectomy was performed with a 1.5 mm bur at up to 120,000 RPMs. Following this balloon angioplasty was performed with a 6 mm x 4 cm chocolate balloon followed by a 7 mm x 8 cm angioplasty balloon in preparation for to pulling a 6.5 mm x 100 mm long IDEV Stent. The right popliteal artery with an angioplasty with a 6 mm x 4 cm chocolate balloon as was the proximal SFA resulting in reduction of a 99% calcified segmental mid right SFA stenosis to 0% residual and a 70% right popliteal stenosis to 0% residual with excellent brisk flow, one-vessel runoff via a. All artery there collateralized the anterior and posterior tibial arteries at the level of the ankle. The distal protection device was then captured and withdrawn from the body. The sheath was withdrawn across the bifurcation and exchanged over an 035 Versicore wire for a short 7 [place] sheath. The patient received 300 mg of by mouth Plavix.  Final impression: Successful diamondback orbital rotational atherectomy, PTA with chocolate balloon and stenting with IDEV stent of high grade segmental mid calcified right SFA stenosis as well as right popliteal stenosis for lifestyle  limiting claudication. The patient does have similar anatomy on the left which will need to be  addressed in a staged fashion. The sheath will be removed once the ACT falls below 170 and pressure will be held on the groin to achieve hemostasis. The patient will be treated with aspirin Plavix, hydrated overnight and discharged home in the morning. He will get followup Dopplers and ABIs and will see me back after that.  [name], [facility]  12/12/2012  Treatments: See above  Discharge Exam: Blood pressure 125/52, pulse 63, temperature 98.6 F (37 C), temperature source Oral, resp. rate 20, height 5\' 11"  (1.803 m), weight 196 lb 6.4 oz (89.086 kg), SpO2 96.00%.   Disposition: 01-Home or Self Care  Discharge Orders   Future Appointments Provider Department Dept Phone   12/29/2012 12:30 PM [facility]  [facility] [address] [phone]   01/11/2013 3:00 PM Runell Gess, MD [facility] [place] [phone]   02/19/2013 6:05 AM [facility] [place] [phone]   Future Orders Complete By Expires   Diet - low sodium heart healthy  As directed    Discharge instructions  As directed    Comments:     No lifting more than a half gallon of milk or driving for three days.   Increase activity slowly  As directed        Medication List    STOP taking these medications       lisinopril-hydrochlorothiazide 20-25 MG per tablet  Commonly known as:  PRINZIDE,ZESTORETIC      TAKE these medications       ANACIN PO  Take 1 tablet by mouth daily as needed. For arthritis pain     aspirin EC 81 MG tablet  Take 81 mg by mouth daily.     clopidogrel 75 MG tablet  Commonly known as:  PLAVIX  Take 1 tablet (75 mg total) by mouth daily with breakfast.     ferrous sulfate 325 (65 FE) MG tablet  Take 325 mg by mouth daily with breakfast.           Follow-up Information   Follow up with Runell Gess, MD. (Our office scheduler will call you with the appt date  and time.)    Specialty:  Cardiology   Contact information:   [address] [place] [address] [phone]       Signed: Wilburt Finlay 12/14/2012, 4:37 PM

## 2012-12-15 ENCOUNTER — Telehealth: Payer: Self-pay | Admitting: Cardiovascular Disease

## 2012-12-15 NOTE — Telephone Encounter (Signed)
Receive a  Message but it was not clear and think it was about Mr. Statzer medication lisinopril .Marland Kitchen Please Call   Thanks

## 2012-12-15 NOTE — Telephone Encounter (Signed)
Returned call and spoke w/ Larene Beach, pt's daughter.  Stated she received a message about lisinopril, but couldn't make it out.  Stated it was muffled.  Daughter informed RN will clarify directions for lisinopril and call her back.  Verbalized understanding.

## 2012-12-15 NOTE — Telephone Encounter (Signed)
Penni Bombard, PA-C notified and stated pt should be taking lisinopril at dose he was on prior to hospitalization.  Returned call and informed daughter per instructions by MD/PA.  Verbalized understanding and agreed w/ plan.

## 2012-12-29 ENCOUNTER — Ambulatory Visit (HOSPITAL_COMMUNITY)
Admission: RE | Admit: 2012-12-29 | Discharge: 2012-12-29 | Disposition: A | Discharge status: Home / Self Care | Payer: Medicare Other | Source: Ambulatory Visit | Attending: Physician Assistant | Admitting: Physician Assistant

## 2012-12-29 DIAGNOSIS — I70219 Atherosclerosis of native arteries of extremities with intermittent claudication, unspecified extremity: Secondary | ICD-10-CM

## 2012-12-29 DIAGNOSIS — I739 Peripheral vascular disease, unspecified: Secondary | ICD-10-CM

## 2012-12-29 NOTE — Progress Notes (Signed)
Right Lower Ext. Arterial Duplex Completed. Dashan Chizmar, BS, RDMS, RVT  

## 2013-01-01 HISTORY — PX: NM MYOVIEW LTD: HXRAD82

## 2013-01-02 ENCOUNTER — Ambulatory Visit: Payer: Medicare Other | Admitting: Cardiovascular Disease

## 2013-01-08 ENCOUNTER — Telehealth: Payer: Self-pay | Admitting: *Deleted

## 2013-01-08 ENCOUNTER — Encounter: Payer: Self-pay | Admitting: *Deleted

## 2013-01-08 DIAGNOSIS — I739 Peripheral vascular disease, unspecified: Secondary | ICD-10-CM

## 2013-01-08 NOTE — Telephone Encounter (Signed)
Message copied by Marella Bile on Mon Jan 08, 2013  2:04 PM ------      Message from: Runell Gess      Created: Fri Jan 05, 2013 10:02 AM       Marked improvement in right ABIs since intervention. Followup study in 6 months ------

## 2013-01-08 NOTE — Telephone Encounter (Signed)
Order placed for repeat LE arterial doppler in 6 months

## 2013-01-10 ENCOUNTER — Ambulatory Visit: Payer: Medicare Other | Admitting: Cardiovascular Disease

## 2013-01-11 ENCOUNTER — Ambulatory Visit: Payer: Medicare Other | Admitting: Cardiovascular Disease

## 2013-01-11 ENCOUNTER — Encounter: Payer: Self-pay | Admitting: Cardiovascular Disease

## 2013-01-11 ENCOUNTER — Ambulatory Visit (INDEPENDENT_AMBULATORY_CARE_PROVIDER_SITE_OTHER): Payer: Medicare Other | Admitting: Cardiovascular Disease

## 2013-01-11 VITALS — BP 148/80 | HR 90 | Ht 71.0 in | Wt 195.4 lb

## 2013-01-11 DIAGNOSIS — I739 Peripheral vascular disease, unspecified: Secondary | ICD-10-CM

## 2013-01-11 NOTE — Assessment & Plan Note (Signed)
Her went abdominal aortography with bifemoral runoff on 12/11/12 revealing highly calcified segmental mid SFA stenoses bilaterally with one vessel runoff via the peroneal artery. I performed diamondback atherectomy of his mid right SFA with IDEV stenting and angioplastyof his popliteal artery. His ABI increased from 0.5-25 as claudication markedly improved. He has similar disease on the left and wishes to proceed with staged diamondback atherectomy of his mid left SFA and stenting.

## 2013-01-11 NOTE — Patient Instructions (Addendum)
Dr. Allyson Sabal has ordered a peripheral angiogram to be done at [facility].  This procedure is going to look at the bloodflow in your lower extremities.  If Dr. Allyson Sabal is able to open up the arteries, you will have to spend one night in the hospital.  If he is not able to open the arteries, you will be able to go home that same day.    After the procedure, you will not be allowed to drive for 3 days or push, pull, or lift anything greater than 10 lbs for one week.    You will receive a phone call from [facility] the day before the procedure.  When they call you, please report to the hospital so that you can begin receiving IV fluids to prepare your kidneys for the upcoming catheterization.  If you have not received a phone call from the hospital by 2pm, please call our office and let us know.    REPS: Minerva Areola and Delice Bison  Hold the lisinopril hct 2 days prior to going into the hospital

## 2013-01-11 NOTE — Progress Notes (Signed)
01/11/2013 Danny Buck   Aug 17, 1921  [id number]  Primary Physician Provider Not In System Primary Cardiologist: Danny Gess MD Roseanne Reno   HPI:  [name] is an 79 year old Afro-American male patient of Dr. Dr. Erin Hearing is is referred for evaluation of claudication/critical limb ischemia. He has a history of complete complete heart block and subsequent pacemaker insertion as well as essential hypertension. He had legitimate Dopplers performed in our office 08/14/12 revealing ABI 0.5-0.6 range with high-grade SFA and tibial vessel disease. He denies chest pain or shortness of breath. While he does not complain of significant claudication he does complain of nocturnal leg pain while recumbent which improved when he puts his legs over the side of the bed which aren't typical for critical limb ischemia. After long discussion with the patient's daughters it was decided to proceed with angiography and potential percutaneous intervention. Procedure occurred on 12/11/12. The patient was admitted the night before for hydration because of chronic renal insufficiency and his lisinopril hydrochlorothiazide was held. He underwent diamondback orbital rotational atherectomy of his mid right SFA with stenting using an IDEV  stent as well as angioplastyof his popliteal artery. His right ABI increased from 0.5-25 with claudication improved as well. He wishes to have his left SFA to posterior fashion.    Current Outpatient Prescriptions  Medication Sig Dispense Refill  . Aspirin (ANACIN PO) Take 1 tablet by mouth daily as needed. For arthritis pain      . aspirin EC 81 MG tablet Take 81 mg by mouth daily.      . clopidogrel (PLAVIX) 75 MG tablet Take 1 tablet (75 mg total) by mouth daily with breakfast.  30 tablet  11  . ferrous sulfate 325 (65 FE) MG tablet Take 325 mg by mouth daily with breakfast.      . lisinopril-hydrochlorothiazide (PRINZIDE,ZESTORETIC) 20-25 MG per tablet Take 1  tablet by mouth daily.       No current facility-administered medications for this visit.    No Known Allergies  History   Social History  . Marital Status: Widowed    Spouse Name: N/A    Number of Children: 8  . Years of Education: N/A   Occupational History  .     Social History Main Topics  . Smoking status: Former Smoker    Quit date: 07/12/1962  . Smokeless tobacco: Never Used     Comment: quit in 1964  . Alcohol Use: No  . Drug Use: No  . Sexual Activity: Not on file   Other Topics Concern  . Not on file   Social History Narrative  . No narrative on file     Review of Systems: General: negative for chills, fever, night sweats or weight changes.  Cardiovascular: negative for chest pain, dyspnea on exertion, edema, orthopnea, palpitations, paroxysmal nocturnal dyspnea or shortness of breath Dermatological: negative for rash Respiratory: negative for cough or wheezing Urologic: negative for hematuria Abdominal: negative for nausea, vomiting, diarrhea, bright red blood per rectum, melena, or hematemesis Neurologic: negative for visual changes, syncope, or dizziness All other systems reviewed and are otherwise negative except as noted above.    Blood pressure 148/80, pulse 90, height 5\' 11"  (1.803 m), weight 195 lb 6.4 oz (88.633 kg).  General appearance: alert and no distress Neck: no adenopathy, no carotid bruit, no JVD, supple, symmetrical, trachea midline and thyroid not enlarged, symmetric, no tenderness/mass/nodules Lungs: clear to auscultation bilaterally Heart: regular rate and rhythm, S1, S2 normal, no murmur, click,  rub or gallop Extremities: his left femoral artery puncture site is well-healed  EKG not performed today  ASSESSMENT AND PLAN:   PAD (peripheral artery disease) Her went abdominal aortography with bifemoral runoff on 12/11/12 revealing highly calcified segmental mid SFA stenoses bilaterally with one vessel runoff via the peroneal artery.  I performed diamondback atherectomy of his mid right SFA with IDEV stenting and angioplastyof his popliteal artery. His ABI increased from 0.5-25 as claudication markedly improved. He has similar disease on the left and wishes to proceed with staged diamondback atherectomy of his mid left SFA and stenting.      Danny Gess MD FACP,FACC,FAHA, [facility] 01/11/2013 4:00 PM

## 2013-01-30 ENCOUNTER — Encounter (HOSPITAL_COMMUNITY): Payer: Self-pay | Admitting: Pharmacy Technician

## 2013-02-08 ENCOUNTER — Telehealth: Payer: Self-pay | Admitting: Cardiovascular Disease

## 2013-02-08 NOTE — Telephone Encounter (Signed)
Patient is scheduled for an angiogram on Tuesday 02/13/13.  The last time that he had this procedure done, he was admitted the day before for IV fluids.  Will he need to be admitted on Monday of next week for fluids?

## 2013-02-09 ENCOUNTER — Telehealth: Payer: Self-pay | Admitting: Cardiovascular Disease

## 2013-02-09 NOTE — Telephone Encounter (Signed)
Mrs. Danny Buck is calling again about her dad being admitted on Monday for his procedure on Tuesday.  He is usually admitted the day before for hydration.Marland Kitchen

## 2013-02-09 NOTE — Telephone Encounter (Signed)
Per Joyce Gross all arrangements have been made for admit Monday PM for hydration prior to angiogram Tuesday.   Daughter notified he will be called Monday when they have a bed available.  Voiced understanding.

## 2013-02-09 NOTE — Telephone Encounter (Signed)
See note on 02/09/13 - he will be admitted night before for IV fluids per scheduling note done in September.

## 2013-02-12 ENCOUNTER — Encounter (HOSPITAL_COMMUNITY): Payer: Self-pay | Admitting: *Deleted

## 2013-02-12 ENCOUNTER — Ambulatory Visit (HOSPITAL_COMMUNITY)
Admission: RE | Admit: 2013-02-12 | Discharge: 2013-02-14 | Disposition: A | Discharge status: Home / Self Care | Payer: Medicare Other | Source: Ambulatory Visit | Attending: Cardiovascular Disease | Admitting: Cardiovascular Disease

## 2013-02-12 DIAGNOSIS — I739 Peripheral vascular disease, unspecified: Secondary | ICD-10-CM

## 2013-02-12 DIAGNOSIS — I70219 Atherosclerosis of native arteries of extremities with intermittent claudication, unspecified extremity: Principal | ICD-10-CM | POA: Insufficient documentation

## 2013-02-12 DIAGNOSIS — Z950 Presence of cardiac pacemaker: Secondary | ICD-10-CM

## 2013-02-12 DIAGNOSIS — I442 Atrioventricular block, complete: Secondary | ICD-10-CM | POA: Insufficient documentation

## 2013-02-12 DIAGNOSIS — I10 Essential (primary) hypertension: Secondary | ICD-10-CM | POA: Insufficient documentation

## 2013-02-12 LAB — CBC
HCT: 30.2 % — ABNORMAL LOW (ref 39.0–52.0)
Hemoglobin: 10.1 g/dL — ABNORMAL LOW (ref 13.0–17.0)
MCH: 25.5 pg — ABNORMAL LOW (ref 26.0–34.0)
MCV: 76.3 fL — ABNORMAL LOW (ref 78.0–100.0)
Platelets: 182 10*3/uL (ref 150–400)
RBC: 3.96 MIL/uL — ABNORMAL LOW (ref 4.22–5.81)
RDW: 15 % (ref 11.5–15.5)
WBC: 3.3 10*3/uL — ABNORMAL LOW (ref 4.0–10.5)

## 2013-02-12 LAB — BASIC METABOLIC PANEL
BUN: 19 mg/dL (ref 6–23)
CO2: 29 mEq/L (ref 19–32)
Calcium: 8.5 mg/dL (ref 8.4–10.5)
Creatinine, Ser: 1.26 mg/dL (ref 0.50–1.35)
GFR calc non Af Amer: 49 mL/min — ABNORMAL LOW (ref >90)
Potassium: 3.6 mEq/L (ref 3.5–5.1)
Sodium: 136 mEq/L (ref 135–145)

## 2013-02-12 LAB — PROTIME-INR: Prothrombin Time: 13.8 seconds (ref 11.6–15.2)

## 2013-02-12 MED ORDER — ENOXAPARIN SODIUM 40 MG/0.4ML ~~LOC~~ SOLN
40.0000 mg | SUBCUTANEOUS | Status: DC
  Administered 2013-02-12: 40 mg via SUBCUTANEOUS
  Filled 2013-02-12 (×2): qty 0.4

## 2013-02-12 MED ORDER — FERROUS SULFATE 325 (65 FE) MG PO TABS
325.0000 mg | ORAL_TABLET | Freq: Every day | ORAL | Status: DC
  Administered 2013-02-13 – 2013-02-14 (×2): 325 mg via ORAL
  Filled 2013-02-12 (×3): qty 1

## 2013-02-12 MED ORDER — SODIUM CHLORIDE 0.9 % IJ SOLN: 3.0000 mL | INTRAMUSCULAR | Status: DC | PRN

## 2013-02-12 MED ORDER — ASPIRIN EC 81 MG PO TBEC: 81.0000 mg | DELAYED_RELEASE_TABLET | Freq: Every day | ORAL | Status: DC

## 2013-02-12 MED ORDER — SODIUM CHLORIDE 0.9 % IV SOLN
1.0000 mL/kg/h | INTRAVENOUS | Status: DC
  Administered 2013-02-12 (×2): 1 mL/kg/h via INTRAVENOUS

## 2013-02-12 MED ORDER — SODIUM CHLORIDE 0.9 % IV SOLN: 250.0000 mL | INTRAVENOUS | Status: DC | PRN

## 2013-02-12 MED ORDER — CLOPIDOGREL BISULFATE 75 MG PO TABS
75.0000 mg | ORAL_TABLET | Freq: Every day | ORAL | Status: DC
  Administered 2013-02-12 – 2013-02-13 (×2): 75 mg via ORAL
  Filled 2013-02-12: qty 1

## 2013-02-12 MED ORDER — ASPIRIN 81 MG PO CHEW
81.0000 mg | CHEWABLE_TABLET | ORAL | Status: AC
  Administered 2013-02-13: 81 mg via ORAL
  Filled 2013-02-12: qty 1

## 2013-02-12 MED ORDER — SODIUM CHLORIDE 0.9 % IJ SOLN
3.0000 mL | Freq: Two times a day (BID) | INTRAMUSCULAR | Status: DC
  Administered 2013-02-12: 3 mL via INTRAVENOUS

## 2013-02-12 NOTE — H&P (Signed)
Danny Buck is an 79 y.o. male.   Chief Complaint:  Claudication/PAD HPI:  79 year old Afro-American male patient of Dr. Dr. Erin Hearing who was referred for evaluation of claudication/critical limb ischemia. He has a history of complete complete heart block and subsequent pacemaker insertion as well as essential hypertension. He had LEA dopplers Dopplers performed in our office 08/14/12 revealing ABI 0.5-0.6 range with high-grade SFA and tibial vessel disease.  While he does not complain of significant claudication he does complain of nocturnal leg pain while recumbent which improved when he puts his legs over the side of the bed which aren't typical for critical limb ischemia.  The patient underwent diamondback orbital rotational atherectomy of his mid right SFA with stenting using an IDEV stent as well as angioplastyof his popliteal artery by Dr. Allyson Sabal on 12/11/12. His right ABI increased from 0.51-0.85 with claudication improved as well.  The patient presents today for hydration prior to undergoing staged diamondback atherectomy of his mid left SFA and stenting.  Other than a cough and occasional LEE, he currently denies nausea, vomiting, fever, chest pain, shortness of breath, orthopnea, dizziness, PND, congestion, abdominal pain, hematochezia, melena,     Past Medical History  Diagnosis Date  . Peripheral arterial disease     Arterial dopplers done  08/10/2011  CT angiogram 08/26/2011 showed reduced ABIs bilaterally with ABI of 0.54on the right and 0.72 on the left  . Hypertension   . Complete heart block   . Pacemaker   . Hypertension   . Arthritis     Past Surgical History  Procedure Laterality Date  . Knee surgery    . Pacemaker insertion  09/09/2011    Adapta Versa DR Dual chamber  [employer]  last checked 11/10/2011,   . Insert / replace / remove pacemaker      Family History  Problem Relation Age of Onset  . Cancer Son     colon  . Cancer Son     brain   Social  History:  reports that he quit smoking about 50 years ago. He has never used smokeless tobacco. He reports that he does not drink alcohol or use illicit drugs.  Allergies: No Known Allergies  Medications Prior to Admission  Medication Sig Dispense Refill  . aspirin EC 81 MG tablet Take 81 mg by mouth daily.      . clopidogrel (PLAVIX) 75 MG tablet Take 1 tablet (75 mg total) by mouth daily with breakfast.  30 tablet  11  . ferrous sulfate 325 (65 FE) MG tablet Take 325 mg by mouth daily with breakfast.      . lisinopril-hydrochlorothiazide (PRINZIDE,ZESTORETIC) 20-25 MG per tablet Take 1 tablet by mouth daily.        No results found for this or any previous visit (from the past 48 hour(s)). No results found.  Review of Systems  Constitutional: Negative for fever and diaphoresis.  HENT: Negative for congestion.   Respiratory: Positive for cough. Negative for sputum production and shortness of breath.   Cardiovascular: Positive for leg swelling. Negative for chest pain, orthopnea and PND.  Gastrointestinal: Negative for nausea, vomiting, abdominal pain and blood in stool.  Genitourinary: Negative for hematuria.  Neurological: Negative for dizziness.  All other systems reviewed and are negative.    Blood pressure 148/68, pulse 75, temperature 97.8 F (36.6 C), temperature source Oral, height 5\' 10"  (1.778 m), weight 202 lb 3.2 oz (91.717 kg). Physical Exam  Constitutional: He is oriented to person, place, and time.  He appears well-developed and well-nourished.  HENT:  Head: Normocephalic and atraumatic.  Eyes: EOM are normal. Pupils are equal, round, and reactive to light.  Neck: Normal range of motion. Neck supple. No JVD present.  Cardiovascular: Normal rate, regular rhythm, S1 normal and S2 normal.   No murmur heard. Pulses:      Radial pulses are 2+ on the right side, and 2+ on the left side.  Respiratory: Effort normal and breath sounds normal. He has no wheezes. He has no  rales.  GI: Soft. Bowel sounds are normal. He exhibits no distension. There is no tenderness.  Musculoskeletal: He exhibits edema.  1+ LEE   Neurological: He is alert and oriented to person, place, and time. He exhibits normal muscle tone.  Skin: Skin is warm and dry.     Assessment/Plan Active Problems:   PAD (peripheral artery disease)  Plan:  Hydrate with NS.  Staged diamondback atherectomy of his mid left SFA and stenting tomorrow.    Abrial Arrighi 02/12/2013, 12:09 PM

## 2013-02-12 NOTE — H&P (Signed)
Pt. Seen and examined. Agree with the NP/PA-C note as written.  My patient who has been sent to Dr. Allyson Sabal for PAD.  He had Diamondback atherectomy on 12/11/12 by Dr. Allyson Sabal for right SFA stenosis and popliteal disease.  He has done well, but still claudicates. He is now admitted for hydration due to critical limb ischemia with mid-left SFA stenosis. He denies rest pain. Plan for hydration and peripheral angiogram tomorrow with Dr. Allyson Sabal.  Chrystie Nose, MD, [facility] Attending Cardiologist [facility]

## 2013-02-12 NOTE — Progress Notes (Signed)
UR Completed Gratia Disla Graves-Bigelow, RN,BSN [phone]  

## 2013-02-13 ENCOUNTER — Encounter (HOSPITAL_COMMUNITY)
Admission: RE | Disposition: A | Discharge status: Home / Self Care | Payer: Self-pay | Source: Ambulatory Visit | Attending: Cardiovascular Disease

## 2013-02-13 DIAGNOSIS — I70219 Atherosclerosis of native arteries of extremities with intermittent claudication, unspecified extremity: Secondary | ICD-10-CM

## 2013-02-13 HISTORY — PX: PERCUTANEOUS STENT INTERVENTION: SHX5500

## 2013-02-13 HISTORY — PX: LOWER EXTREMITY ANGIOGRAM: SHX5508

## 2013-02-13 LAB — BASIC METABOLIC PANEL
BUN: 16 mg/dL (ref 6–23)
Calcium: 8.2 mg/dL — ABNORMAL LOW (ref 8.4–10.5)
Chloride: 104 mEq/L (ref 96–112)
Creatinine, Ser: 1.1 mg/dL (ref 0.50–1.35)
GFR calc Af Amer: 67 mL/min — ABNORMAL LOW (ref >90)
GFR calc non Af Amer: 58 mL/min — ABNORMAL LOW (ref >90)
Glucose, Bld: 88 mg/dL (ref 70–99)

## 2013-02-13 LAB — POCT ACTIVATED CLOTTING TIME
Activated Clotting Time: 211 seconds
Activated Clotting Time: 222 seconds
Activated Clotting Time: 191 seconds
Activated Clotting Time: 170 seconds

## 2013-02-13 SURGERY — ANGIOGRAM, LOWER EXTREMITY
Anesthesia: LOCAL

## 2013-02-13 MED ORDER — MORPHINE SULFATE 2 MG/ML IJ SOLN: 1.0000 mg | INTRAMUSCULAR | Status: DC | PRN

## 2013-02-13 MED ORDER — HEPARIN (PORCINE) IN NACL 2-0.9 UNIT/ML-% IJ SOLN
INTRAMUSCULAR | Status: AC
  Filled 2013-02-13: qty 1000

## 2013-02-13 MED ORDER — ONDANSETRON HCL 4 MG/2ML IJ SOLN: 4.0000 mg | Freq: Four times a day (QID) | INTRAMUSCULAR | Status: DC | PRN

## 2013-02-13 MED ORDER — CLOPIDOGREL BISULFATE 75 MG PO TABS
75.0000 mg | ORAL_TABLET | Freq: Every day | ORAL | Status: DC
  Administered 2013-02-14: 09:00:00 75 mg via ORAL
  Filled 2013-02-13: qty 1

## 2013-02-13 MED ORDER — NITROGLYCERIN 0.2 MG/ML ON CALL CATH LAB
INTRAVENOUS | Status: AC
  Filled 2013-02-13: qty 1

## 2013-02-13 MED ORDER — ACETAMINOPHEN 325 MG PO TABS: 650.0000 mg | ORAL_TABLET | ORAL | Status: DC | PRN

## 2013-02-13 MED ORDER — ASPIRIN EC 325 MG PO TBEC
325.0000 mg | DELAYED_RELEASE_TABLET | Freq: Every day | ORAL | Status: DC
  Administered 2013-02-14: 09:00:00 325 mg via ORAL
  Filled 2013-02-13: qty 1

## 2013-02-13 MED ORDER — CLOPIDOGREL BISULFATE 75 MG PO TABS: 75.0000 mg | ORAL_TABLET | Freq: Every day | ORAL | Status: DC

## 2013-02-13 MED ORDER — HEPARIN SODIUM (PORCINE) 1000 UNIT/ML IJ SOLN
INTRAMUSCULAR | Status: AC
  Filled 2013-02-13: qty 1

## 2013-02-13 MED ORDER — HYDRALAZINE HCL 20 MG/ML IJ SOLN
10.0000 mg | INTRAMUSCULAR | Status: DC
  Administered 2013-02-13: 19:00:00 10 mg via INTRAVENOUS
  Filled 2013-02-13: qty 1

## 2013-02-13 MED ORDER — VERAPAMIL HCL 2.5 MG/ML IV SOLN
INTRAVENOUS | Status: AC
  Filled 2013-02-13: qty 2

## 2013-02-13 MED ORDER — SODIUM CHLORIDE 0.9 % IV SOLN: INTRAVENOUS | Status: AC

## 2013-02-13 MED ORDER — LIDOCAINE HCL (PF) 1 % IJ SOLN
INTRAMUSCULAR | Status: AC
  Filled 2013-02-13: qty 30

## 2013-02-13 MED ORDER — ASPIRIN EC 325 MG PO TBEC: 325.0000 mg | DELAYED_RELEASE_TABLET | Freq: Every day | ORAL | Status: DC

## 2013-02-13 NOTE — Interval H&P Note (Signed)
History and Physical Interval Note:  02/13/2013 1:06 PM  Danny Buck  has presented today for surgery, with the diagnosis of Claudication  The various methods of treatment have been discussed with the patient and family. After consideration of risks, benefits and other options for treatment, the patient has consented to  Procedure(s): LOWER EXTREMITY ANGIOGRAM (N/A) as a surgical intervention .  The patient's history has been reviewed, patient examined, no change in status, stable for surgery.  I have reviewed the patient's chart and labs.  Questions were answered to the patient's satisfaction.     Runell Gess

## 2013-02-13 NOTE — CV Procedure (Signed)
Jamare Vanatta is a 79 y.o. male    [id number] LOCATION:  FACILITY: [facility]  PHYSICIAN: Nanetta Batty, M.D. 07-08-1921   DATE OF PROCEDURE:  02/13/2013  DATE OF DISCHARGE:     PV Angiogram/Intervention    History obtained from chart review.79 year old Afro-American male patient of Dr. Dr. Erin Hearing who was referred for evaluation of claudication/critical limb ischemia. He has a history of complete complete heart block and subsequent pacemaker insertion as well as essential hypertension. He had LEA dopplers Dopplers performed in our office 08/14/12 revealing ABI 0.5-0.6 range with high-grade SFA and tibial vessel disease. While he does not complain of significant claudication he does complain of nocturnal leg pain while recumbent which improved when he puts his legs over the side of the bed which aren't typical for critical limb ischemia. The patient underwent diamondback orbital rotational atherectomy of his mid right SFA with stenting using an IDEV stent as well as angioplastyof his popliteal artery by Dr. Allyson Sabal on 12/11/12. His right ABI increased from 0.51-0.85 with claudication improved as well.  The patient presents today for hydration prior to undergoing staged diamondback atherectomy of his mid left SFA and stenting.   PROCEDURE DESCRIPTION:   The patient was brought to the second floor [facility] cath lab in the postabsorptive state. He was premedicated with Valium 5 mg by mouth, IV Versed and fentanyl. His right groinwas prepped and shaved in usual sterile fashion. Xylocaine 1% was used for local anesthesia. A 7 French sheath was inserted into the right common femoral artery using standard Seldinger technique. The patient received 9500 units  of upper and  intravenously.  A total of 105 cc was administered to the patient. Visipaque was used for the entirety of the case. Retrograde aortic pressure was monitored during the case.    HEMODYNAMICS:    AO SYSTOLIC/AO  DIASTOLIC: 161/69   Angiographic Data:   Contralateral access was obtained with a 5 [place] crossover catheter 7 French/55 cm in Ansel sheath. The stenosed area was crossed with a 014 Rigali a wire loaded him and 018 endhole quick cross catheter. The Rigali was then removed and exchanged for a 014 viper wire. A large NAV 6 filter was then deployed in the above the knee  popliteal segment. Using a 1.5 mm burr multiple passes were made with the diamondback orbital atherectomy device up to 120,000 rpm's. Intra-arterial nitroglycerin was given liberally. Following this angioplasty was performed with a 5 mm x 120 mm long chocolate balloon for 3 minutes at nominal pressures. A 7 mm x 120 mm long [place] balloon was then used to predilate prior to stenting with a 6.5 mm x 120 mm long Supera  Stent. This was then carefully positioned and deployed with excellent stent apposition. The final angiographic result with a runoff showed 0% residual with one vessel runoff via the peroneal and excellent collateral filling of the dorsalis pedis.  IMPRESSION:Successful diamondback orbital rotational atherectomy, PTA and stenting using a Supera self-expanding stent for lifestyle limiting claudication. The sheath was then withdrawn across the bifurcation and exchanged for a short 7 [place] sheath. This was secured in place. The patient left the lab in stable condition. The sheath will be removed once the ACT falls below 170 (ACT 222 at the end of the case). Pressure will be held on the groin to achieve hemostasis. The patient will be treated with aspirin and Plavix. He will be discharged home in the morning and will obtain outpatient arterial Doppler studies. We will then  see me back in followup.      [name], [facility] 02/13/2013 2:47 PM

## 2013-02-13 NOTE — Progress Notes (Signed)
Site area: right groin  Site Prior to Removal:  Level 0  Pressure Applied For 20 MINUTES    Minutes Beginning at 1922  Manual:   yes  Patient Status During Pull:  AA) x3  Post Pull Groin Site:  Level 0  Post Pull Instructions Given:  yes  Post Pull Pulses Present:  yes  Dressing Applied:  yes  Comments:  Tolerated procedure well

## 2013-02-13 NOTE — Progress Notes (Signed)
   Subjective: No complaints.   Objective: Vital signs in last 24 hours: Temp:  [97.8 F (36.6 C)-98.2 F (36.8 C)] 98 F (36.7 C) (10/14 0521) Pulse Rate:  [60-75] 65 (10/14 0521) Resp:  [17-18] 18 (10/14 0521) BP: (130-148)/(62-71) 130/62 mmHg (10/14 0521) SpO2:  [99 %-100 %] 99 % (10/14 0521) Weight:  [202 lb 3.2 oz (91.717 kg)] 202 lb 3.2 oz (91.717 kg) (10/13 1144) Last BM Date: 02/12/13  Intake/Output from previous day: 10/13 0701 - 10/14 0700 In: 1432.1 [P.O.:240; I.V.:1192.1] Out: 1150 [Urine:1150] Intake/Output this shift:    Medications Current Facility-Administered Medications  Medication Dose Route Frequency Provider Last Rate Last Dose  . 0.9 %  sodium chloride infusion  250 mL Intravenous PRN Wilburt Finlay, PA-C      . 0.9 %  sodium chloride infusion  1 mL/kg/hr Intravenous Continuous Wilburt Finlay, PA-C 91.7 mL/hr at 02/12/13 2353 1 mL/kg/hr at 02/12/13 2353  . [START ON 02/14/2013] aspirin EC tablet 81 mg  81 mg Oral Daily Wilburt Finlay, PA-C      . clopidogrel (PLAVIX) tablet 75 mg  75 mg Oral Q breakfast Wilburt Finlay, PA-C   75 mg at 02/13/13 0520  . enoxaparin (LOVENOX) injection 40 mg  40 mg Subcutaneous Q24H Wilburt Finlay, PA-C   40 mg at 02/12/13 2119  . ferrous sulfate tablet 325 mg  325 mg Oral Q breakfast Wilburt Finlay, PA-C   325 mg at 02/13/13 1610  . sodium chloride 0.9 % injection 3 mL  3 mL Intravenous Q12H Wilburt Finlay, PA-C   3 mL at 02/12/13 1425  . sodium chloride 0.9 % injection 3 mL  3 mL Intravenous PRN Wilburt Finlay, PA-C        PE: General appearance: alert, cooperative and no distress Lungs: clear to auscultation bilaterally Heart: regular rate and rhythm, S1, S2 normal, no murmur, click, rub or gallop Extremities: no LEE Pulses: 1+ and symmetric Skin: warm and dry Neurologic: Grossly normal  Lab Results:   Recent Labs  02/12/13 1416  WBC 3.3*  HGB 10.1*  HCT 30.2*  PLT 182   BMET  Recent Labs  02/12/13 1416 02/13/13 0430  NA 136  140  K 3.6 3.5  CL 100 104  CO2 29 27  GLUCOSE 111* 88  BUN 19 16  CREATININE 1.26 1.10  CALCIUM 8.5 8.2*   PT/INR  Recent Labs  02/12/13 1416  LABPROT 13.8  INR 1.08     Assessment/Plan  Active Problems:   PAD (peripheral artery disease)  Plan: Admitted yesterday for pre-cath hydration for planned PV angio. SCr today is 1.10. INR is WNL. Plan for procedure today with Dr. Allyson Sabal. Will rehydrate post cath.     LOS: 1 day    Louvenia Golomb M. Sharol Harness, PA-C 02/13/2013 9:48 AM

## 2013-02-14 ENCOUNTER — Other Ambulatory Visit: Payer: Self-pay | Admitting: Cardiology

## 2013-02-14 DIAGNOSIS — Z950 Presence of cardiac pacemaker: Secondary | ICD-10-CM

## 2013-02-14 DIAGNOSIS — I739 Peripheral vascular disease, unspecified: Secondary | ICD-10-CM

## 2013-02-14 LAB — CBC
HCT: 28.2 % — ABNORMAL LOW (ref 39.0–52.0)
MCH: 25.2 pg — ABNORMAL LOW (ref 26.0–34.0)
MCV: 74.8 fL — ABNORMAL LOW (ref 78.0–100.0)
Platelets: 170 10*3/uL (ref 150–400)
RBC: 3.77 MIL/uL — ABNORMAL LOW (ref 4.22–5.81)
RDW: 14.7 % (ref 11.5–15.5)
WBC: 3.6 10*3/uL — ABNORMAL LOW (ref 4.0–10.5)

## 2013-02-14 LAB — BASIC METABOLIC PANEL
CO2: 26 mEq/L (ref 19–32)
Calcium: 7.9 mg/dL — ABNORMAL LOW (ref 8.4–10.5)
Chloride: 107 mEq/L (ref 96–112)
GFR calc non Af Amer: 59 mL/min — ABNORMAL LOW (ref >90)
Glucose, Bld: 91 mg/dL (ref 70–99)
Sodium: 141 mEq/L (ref 135–145)

## 2013-02-14 NOTE — Discharge Summary (Signed)
Physician Discharge Summary     Patient ID: Danny Buck MRN: [id number] DOB/AGE: [birth year] 79 y.o.  Admit date: 02/12/2013 Discharge date: 02/14/2013  Admission Diagnoses: Peripheral Artery Disease w/ lifestyle limiting claudication  Discharge Diagnoses:  Active Problems:   PAD (peripheral artery disease)   Discharged Condition: stable  Hospital Course: The patient is a 79 year old Afro-American male patient of Dr. Dr. Erin Hearing who was referred to Dr. Allyson Sabal for evaluation of claudication/critical limb ischemia. He has a history of complete complete heart block and subsequent pacemaker insertion as well as essential hypertension. He had LEA  Dopplers performed in our office 08/14/12 revealing ABI in 0.5-0.6 range with high-grade SFA and tibial vessel disease. While he does not complain of significant claudication he does complain of nocturnal leg pain while recumbent which improves when he puts his legs over the side of the bed, which aren't typical for critical limb ischemia. The patient underwent diamondback orbital rotational atherectomy of his mid right SFA with stenting using an IDEV stent as well as angioplasty of his popliteal artery by Dr. Allyson Sabal on 12/11/12. His right ABI increased from 0.51-0.85 with claudication improved as well.   He presented back to [facility] on 02/12/13 for IV hydration prior to undergoing staged intervention of his mid left SFA.  The procedure was performed by Dr. Allyson Sabal on 02/13/13. He underwent successful diamondback orbital rotational atherectomy, PTA and stenting of the left SFA using a Supera self-expanding stent. He tolerated the procedure well and left the cath lab in stable condition. He was resumed on ASA and Plavix. He was kept overnight for hydration. He had no complications. His renal function remained stable. The right femoral access site remained stable. He had no pain ambulating. He was last seen and examined by Dr. Allyson Sabal, who determined that he was  stable for discharge home. He will follow-up in the office for lower extremity duplex ultrasound, then will follow-up with Dr. Allyson Sabal.   Consults: None  Significant Diagnostic Studies:   PV Angio 02/13/13 HEMODYNAMICS:  AO SYSTOLIC/AO DIASTOLIC: 161/69  Angiographic Data:  Contralateral access was obtained with a 5 [place] crossover catheter 7 French/55 cm in Ansel sheath. The stenosed area was crossed with a 014 Rigali a wire loaded him and 018 endhole quick cross catheter. The Rigali was then removed and exchanged for a 014 viper wire. A large NAV 6 filter was then deployed in the above the knee popliteal segment. Using a 1.5 mm burr multiple passes were made with the diamondback orbital atherectomy device up to 120,000 rpm's. Intra-arterial nitroglycerin was given liberally. Following this angioplasty was performed with a 5 mm x 120 mm long chocolate balloon for 3 minutes at nominal pressures. A 7 mm x 120 mm long [place] balloon was then used to predilate prior to stenting with a 6.5 mm x 120 mm long Supera Stent. This was then carefully positioned and deployed with excellent stent apposition. The final angiographic result with a runoff showed 0% residual with one vessel runoff via the peroneal and excellent collateral filling of the dorsalis pedis.   Treatments: See Hospital Course  Discharge Exam: Blood pressure 144/71, pulse 69, temperature 98.2 F (36.8 C), temperature source Oral, resp. rate 18, height 5\' 10"  (1.778 m), weight 199 lb 8.3 oz (90.5 kg), SpO2 98.00%.   Disposition: 01-Home or Self Care      Discharge Orders   Future Appointments Provider Department Dept Phone   02/19/2013 6:05 AM [facility]line Device Remotes [facility] [phone]   Future  Orders Complete By Expires   Diet - low sodium heart healthy  As directed    Driving Restrictions  As directed    Comments:     No driving for 3 days   Increase activity slowly  As directed    Lifting restrictions  As  directed    Comments:     No lifting more than 5 lbs for 3 days       Medication List         aspirin EC 81 MG tablet  Take 81 mg by mouth daily.     clopidogrel 75 MG tablet  Commonly known as:  PLAVIX  Take 1 tablet (75 mg total) by mouth daily with breakfast.     ferrous sulfate 325 (65 FE) MG tablet  Take 325 mg by mouth daily with breakfast.     lisinopril-hydrochlorothiazide 20-25 MG per tablet  Commonly known as:  PRINZIDE,ZESTORETIC  Take 1 tablet by mouth daily.       Follow-up Information   Follow up with Runell Gess, MD. (our office will call you with a follow-up appoitment with Dr. Allyson Sabal)    Specialty:  Cardiology   Contact information:   [address] [place] [address] [phone]       Follow up with [facility]. (our office will call you with an appoitnment for your ultrasound)    Contact information:   [phone]    TIME SPENT ON DISCHARGE, INCLUDING PHYSICIAN TIME: >30 MINUTES  Signed: Allayne Butcher, PA-C 02/14/2013, 11:50 AM

## 2013-02-14 NOTE — Progress Notes (Signed)
Subjective: No complaints. He denies right groin, flank or back pain. He has not walked today.  Objective: Vital signs in last 24 hours: Temp:  [97.5 F (36.4 C)-98.5 F (36.9 C)] 98.1 F (36.7 C) (10/15 0353) Pulse Rate:  [59-68] 60 (10/15 0353) Resp:  [16-20] 20 (10/15 0353) BP: (104-166)/(40-75) 137/51 mmHg (10/15 0353) SpO2:  [97 %-100 %] 97 % (10/15 0353) Weight:  [199 lb 8.3 oz (90.5 kg)] 199 lb 8.3 oz (90.5 kg) (10/15 0037) Last BM Date: 02/12/13  Intake/Output from previous day: 10/14 0701 - 10/15 0700 In: 1131.3 [P.O.:240; I.V.:891.3] Out: 1030 [Urine:1030] Intake/Output this shift:    Medications Current Facility-Administered Medications  Medication Dose Route Frequency Provider Last Rate Last Dose  . acetaminophen (TYLENOL) tablet 650 mg  650 mg Oral Q4H PRN Runell Gess, MD      . aspirin EC tablet 325 mg  325 mg Oral Daily Runell Gess, MD      . clopidogrel (PLAVIX) tablet 75 mg  75 mg Oral Q breakfast Runell Gess, MD      . ferrous sulfate tablet 325 mg  325 mg Oral Q breakfast Wilburt Finlay, PA-C   325 mg at 02/13/13 1610  . hydrALAZINE (APRESOLINE) injection 10 mg  10 mg Intravenous UD Runell Gess, MD   10 mg at 02/13/13 1850  . morphine 2 MG/ML injection 1 mg  1 mg Intravenous Q1H PRN Runell Gess, MD      . ondansetron [facility]) injection 4 mg  4 mg Intravenous Q6H PRN Runell Gess, MD        PE: General appearance: alert, cooperative and no distress Lungs: clear to auscultation bilaterally Heart: regular rate and rhythm, S1, S2 normal, no murmur, click, rub or gallop Extremities: no LEE Pulses: 2+ radials. 1+ faint DPs Skin: warm and dry Neurologic: Grossly normal  Lab Results:   Recent Labs  02/12/13 1416 02/14/13 0350  WBC 3.3* 3.6*  HGB 10.1* 9.5*  HCT 30.2* 28.2*  PLT 182 170   BMET  Recent Labs  02/12/13 1416 02/13/13 0430 02/14/13 0350  NA 136 140 141  K 3.6 3.5 3.8  CL 100 104 107  CO2 29 27 26     GLUCOSE 111* 88 91  BUN 19 16 13   CREATININE 1.26 1.10 1.08  CALCIUM 8.5 8.2* 7.9*   PT/INR  Recent Labs  02/12/13 1416  LABPROT 13.8  INR 1.08   Cholesterol No results found for this basename: CHOL,  in the last 72 hours Cardiac Enzymes No components found with this basename: TROPONIN,  CK[facility],   Studies/Results:  PV Angio 02/13/13   HEMODYNAMICS:  AO SYSTOLIC/AO DIASTOLIC: 161/69  Angiographic Data:  Contralateral access was obtained with a 5 [place] crossover catheter 7 French/55 cm in Ansel sheath. The stenosed area was crossed with a 014 Rigali a wire loaded him and 018 endhole quick cross catheter. The Rigali was then removed and exchanged for a 014 viper wire. A large NAV 6 filter was then deployed in the above the knee popliteal segment. Using a 1.5 mm burr multiple passes were made with the diamondback orbital atherectomy device up to 120,000 rpm's. Intra-arterial nitroglycerin was given liberally. Following this angioplasty was performed with a 5 mm x 120 mm long chocolate balloon for 3 minutes at nominal pressures. A 7 mm x 120 mm long [place] balloon was then used to predilate prior to stenting with a 6.5 mm x 120 mm long Supera  Stent. This was then carefully positioned and deployed with excellent stent apposition. The final angiographic result with a runoff showed 0% residual with one vessel runoff via the peroneal and excellent collateral filling of the dorsalis pedis.    Assessment/Plan  Active Problems:   PAD (peripheral artery disease)  Plan: S/p successful diamondback orbital rotational atherectomy, PTA and stenting of left SFA using a Supera self-expanding stent for lifestyle limiting claudication. No pain. Right femoral access site is stable. Continue ASA and Plavix. Renal function is stable. SCr today is 1.08. Will have pt ambulate the halls with RN. If stable, plan for discharge today with OP LEA and f/u with Dr. Allyson Sabal.  .   LOS: 2 days    Brittainy M.  Delmer Islam 02/14/2013 8:05 AM  Agree with note written by Boyce Medici  [facility]  Looks great!! S/P DB atherectomy, PCI / Stent with IDEV. He has a palpable LPP. Right groin OK. Labs OK. D/C home on ASA/plavix. LEA then ROV.    Runell Gess 02/14/2013 10:27 AM

## 2013-02-19 ENCOUNTER — Ambulatory Visit (INDEPENDENT_AMBULATORY_CARE_PROVIDER_SITE_OTHER): Payer: Medicare Other

## 2013-02-19 DIAGNOSIS — I471 Supraventricular tachycardia: Secondary | ICD-10-CM

## 2013-02-19 DIAGNOSIS — I442 Atrioventricular block, complete: Secondary | ICD-10-CM

## 2013-02-19 DIAGNOSIS — I472 Ventricular tachycardia: Secondary | ICD-10-CM

## 2013-02-19 LAB — PACEMAKER DEVICE OBSERVATION

## 2013-02-26 ENCOUNTER — Encounter (HOSPITAL_COMMUNITY): Payer: Self-pay | Admitting: *Deleted

## 2013-02-26 ENCOUNTER — Encounter: Payer: Self-pay | Admitting: Cardiovascular Disease

## 2013-02-26 NOTE — Telephone Encounter (Signed)
Call to pt and verified x 2.  Informed message received r/t him having foot/ankle pain.  Pt c/o L foot/ankle pain ~5am every morning.  Stated when he sits up for a bit it goes away and then it comes back.  Pt stated he did receive a call today to set up an appt for his doppler, which is next Friday (11/7) at 10am.  Pt also scheduled w/ Dr. Allyson Sabal on 11/10 for f/u.  Pt informed Dr. Allyson Sabal will be notified of complaints and may want test to be done sooner.  Pt stated he will be okay until Friday.  Stated his daughter has to bring him to appts and she can only bring him on Monday, Tuesday or Friday.  Pt denied other symptoms like swelling, redness, warmness, other color changes or pain now.  Per pt, daughter checks MyChart account and informed message will be sent back to her as well.  Pt verbalized understanding and agreed w/ plan.  Message forwarded to Dr. Allyson Sabal for review and further instructions.

## 2013-03-01 ENCOUNTER — Encounter: Payer: Self-pay | Admitting: *Deleted

## 2013-03-01 LAB — REMOTE PACEMAKER DEVICE
AL THRESHOLD: 0.5 V
RV LEAD IMPEDENCE PM: 393 Ohm
RV LEAD THRESHOLD: 0.5 V
VENTRICULAR PACING PM: 99

## 2013-03-08 ENCOUNTER — Other Ambulatory Visit: Payer: Self-pay

## 2013-03-09 ENCOUNTER — Ambulatory Visit (HOSPITAL_COMMUNITY)
Admission: RE | Admit: 2013-03-09 | Discharge: 2013-03-09 | Disposition: A | Discharge status: Home / Self Care | Payer: Medicare Other | Source: Ambulatory Visit | Attending: Internal Medicine | Admitting: Internal Medicine

## 2013-03-09 ENCOUNTER — Other Ambulatory Visit (HOSPITAL_COMMUNITY): Payer: Self-pay | Admitting: Cardiovascular Disease

## 2013-03-09 DIAGNOSIS — I739 Peripheral vascular disease, unspecified: Secondary | ICD-10-CM | POA: Insufficient documentation

## 2013-03-09 NOTE — Progress Notes (Signed)
Left Lower Extremity Arterial Duplex Completed. °Brianna L Mazza,RVT °

## 2013-03-12 ENCOUNTER — Ambulatory Visit (INDEPENDENT_AMBULATORY_CARE_PROVIDER_SITE_OTHER): Payer: Medicare Other | Admitting: Cardiovascular Disease

## 2013-03-12 ENCOUNTER — Encounter: Payer: Self-pay | Admitting: Cardiovascular Disease

## 2013-03-12 VITALS — BP 130/74 | HR 72 | Ht 71.0 in | Wt 199.8 lb

## 2013-03-12 DIAGNOSIS — I10 Essential (primary) hypertension: Secondary | ICD-10-CM

## 2013-03-12 DIAGNOSIS — I739 Peripheral vascular disease, unspecified: Secondary | ICD-10-CM

## 2013-03-12 NOTE — Progress Notes (Signed)
03/12/2013 [name]   [birth year]  [id number]  Primary Physician Runell Gess, MD Primary Cardiologist: [name] Roseanne Reno   HPI:  [name] is an 79 year old- Afro-American male patient of Dr. Dr. Erin Hearing who was referred for evaluation of claudication/critical limb ischemia. He has a history of complete complete heart block and subsequent pacemaker insertion as well as essential hypertension. He had LEA dopplers Dopplers performed in our office 08/14/12 revealing ABI 0.5-0.6 range with high-grade SFA and tibial vessel disease. While he does not complain of significant claudication he does complain of nocturnal leg pain while recumbent which improved when he puts his legs over the side of the bed which aren't typical for critical limb ischemia. The patient underwent diamondback orbital rotational atherectomy of his mid right SFA with stenting using an IDEV stent as well as angioplastyof his popliteal artery by Dr. Allyson Sabal on 12/11/12. His right ABI increased from 0.51-0.85 with claudication improved as well. On 02/13/13 he underwent staged left SFA diamondback orbital rotational atherectomy, PTA and stenting with excellent angiographic result. He did have one vessel runoff via the peroneal. His ABI increased to 0.91 from 0.66 initially. He still has nocturnal left heel pain.     Current Outpatient Prescriptions  Medication Sig Dispense Refill  . aspirin EC 81 MG tablet Take 81 mg by mouth daily.      . clopidogrel (PLAVIX) 75 MG tablet Take 1 tablet (75 mg total) by mouth daily with breakfast.  30 tablet  11  . ferrous sulfate 325 (65 FE) MG tablet Take 325 mg by mouth daily with breakfast.      . lisinopril-hydrochlorothiazide (PRINZIDE,ZESTORETIC) 20-25 MG per tablet Take 1 tablet by mouth daily.       No current facility-administered medications for this visit.    No Known Allergies  History   Social History  . Marital Status: Widowed   Spouse Name: N/A    Number of Children: 8  . Years of Education: N/A   Occupational History  .     Social History Main Topics  . Smoking status: Former Smoker    Quit date: 07/12/1962  . Smokeless tobacco: Never Used     Comment: quit in 1964  . Alcohol Use: No  . Drug Use: No  . Sexual Activity: Not on file   Other Topics Concern  . Not on file   Social History Narrative  . No narrative on file     Review of Systems: General: negative for chills, fever, night sweats or weight changes.  Cardiovascular: negative for chest pain, dyspnea on exertion, edema, orthopnea, palpitations, paroxysmal nocturnal dyspnea or shortness of breath Dermatological: negative for rash Respiratory: negative for cough or wheezing Urologic: negative for hematuria Abdominal: negative for nausea, vomiting, diarrhea, bright red blood per rectum, melena, or hematemesis Neurologic: negative for visual changes, syncope, or dizziness All other systems reviewed and are otherwise negative except as noted above.    Blood pressure 130/74, pulse 72, height 5\' 11"  (1.803 m), weight 199 lb 12.8 oz (90.629 kg).  General appearance: alert and no distress Neck: no adenopathy, no carotid bruit, no JVD, supple, symmetrical, trachea midline and thyroid not enlarged, symmetric, no tenderness/mass/nodules Lungs: clear to auscultation bilaterally Heart: regular rate and rhythm, S1, S2 normal, no murmur, click, rub or gallop Extremities: extremities normal, atraumatic, no cyanosis or edema  EKG not performed today  ASSESSMENT AND PLAN:   PAD (peripheral artery disease) Status post bilateral diamondback orbital rotational  atherectomy, PTA and stenting of both SFAs in a staged fashion having completed the right one 12/11/12 and the left one 02/13/13. His followup arterial Dopplers revealed ABIs of 0.95 on the right and 0.91 on the left. He does have one vessel runoff bilaterally via peroneal arteries. His right leg is  markedly improved but he still has nocturnal heel pain on the left. I'm going to refer him to a podiatrist for further evaluation.  Essential hypertension Well-controlled on current medications      [name] [facility], [facility] 03/12/2013 2:18 PM

## 2013-03-12 NOTE — Assessment & Plan Note (Signed)
Status post bilateral diamondback orbital rotational atherectomy, PTA and stenting of both SFAs in a staged fashion having completed the right one 12/11/12 and the left one 02/13/13. His followup arterial Dopplers revealed ABIs of 0.95 on the right and 0.91 on the left. He does have one vessel runoff bilaterally via peroneal arteries. His right leg is markedly improved but he still has nocturnal heel pain on the left. I'm going to refer him to a podiatrist for further evaluation.

## 2013-03-12 NOTE — Patient Instructions (Signed)
  Your physician wants you to follow-up with him in : 6 months with Dr San Morelle will receive a reminder letter in the mail one month in advance. If you don't receive a letter, please call our office to schedule the follow-up appointment.   Your physician has ordered the following tests: lower extremity arterial doppler in 6 months  We will refer you to Dr Charlsie Merles for your heel pain and peripheral artery disease.

## 2013-03-12 NOTE — Assessment & Plan Note (Signed)
Well-controlled on current medications 

## 2013-03-15 ENCOUNTER — Ambulatory Visit: Payer: Medicare Other | Admitting: Cardiovascular Disease

## 2013-03-23 ENCOUNTER — Ambulatory Visit (INDEPENDENT_AMBULATORY_CARE_PROVIDER_SITE_OTHER): Payer: Medicare Other | Admitting: Podiatry

## 2013-03-23 ENCOUNTER — Ambulatory Visit (INDEPENDENT_AMBULATORY_CARE_PROVIDER_SITE_OTHER): Payer: Medicare Other

## 2013-03-23 ENCOUNTER — Encounter: Payer: Self-pay | Admitting: Podiatry

## 2013-03-23 VITALS — BP 113/84 | HR 83 | Resp 14 | Ht 71.0 in | Wt 199.0 lb

## 2013-03-23 DIAGNOSIS — M79672 Pain in left foot: Secondary | ICD-10-CM

## 2013-03-23 DIAGNOSIS — M7660 Achilles tendinitis, unspecified leg: Secondary | ICD-10-CM

## 2013-03-23 DIAGNOSIS — B351 Tinea unguium: Secondary | ICD-10-CM

## 2013-03-23 DIAGNOSIS — M79609 Pain in unspecified limb: Secondary | ICD-10-CM

## 2013-03-23 MED ORDER — TRIAMCINOLONE ACETONIDE 10 MG/ML IJ SUSP
5.0000 mg | Freq: Once | INTRAMUSCULAR | Status: AC
  Administered 2013-03-23: 5 mg via INTRA_ARTICULAR

## 2013-03-23 NOTE — Patient Instructions (Signed)
Plantar Fasciitis (Heel Spur Syndrome) with Rehab The plantar fascia is a fibrous, ligament-like, soft-tissue structure that spans the bottom of the foot. Plantar fasciitis is a condition that causes pain in the foot due to inflammation of the tissue. SYMPTOMS   Pain and tenderness on the underneath side of the foot.  Pain that worsens with standing or walking. CAUSES  Plantar fasciitis is caused by irritation and injury to the plantar fascia on the underneath side of the foot. Common mechanisms of injury include:  Direct trauma to bottom of the foot.  Damage to a small nerve that runs under the foot where the main fascia attaches to the heel bone.  Stress placed on the plantar fascia due to bone spurs. RISK INCREASES WITH:   Activities that place stress on the plantar fascia (running, jumping, pivoting, or cutting).  Poor strength and flexibility.  Improperly fitted shoes.  Tight calf muscles.  Flat feet.  Failure to warm-up properly before activity.  Obesity. PREVENTION  Warm up and stretch properly before activity.  Allow for adequate recovery between workouts.  Maintain physical fitness:  Strength, flexibility, and endurance.  Cardiovascular fitness.  Maintain a health body weight.  Avoid stress on the plantar fascia.  Wear properly fitted shoes, including arch supports for individuals who have flat feet. PROGNOSIS  If treated properly, then the symptoms of plantar fasciitis usually resolve without surgery. However, occasionally surgery is necessary. RELATED COMPLICATIONS   Recurrent symptoms that may result in a chronic condition.  Problems of the lower back that are caused by compensating for the injury, such as limping.  Pain or weakness of the foot during push-off following surgery.  Chronic inflammation, scarring, and partial or complete fascia tear, occurring more often from repeated injections. TREATMENT  Treatment initially involves the use of  ice and medication to help reduce pain and inflammation. The use of strengthening and stretching exercises may help reduce pain with activity, especially stretches of the Achilles tendon. These exercises may be performed at home or with a therapist. Your caregiver may recommend that you use heel cups of arch supports to help reduce stress on the plantar fascia. Occasionally, corticosteroid injections are given to reduce inflammation. If symptoms persist for greater than 6 months despite non-surgical (conservative), then surgery may be recommended.  MEDICATION   If pain medication is necessary, then nonsteroidal anti-inflammatory medications, such as aspirin and ibuprofen, or other minor pain relievers, such as acetaminophen, are often recommended.  Do not take pain medication within 7 days before surgery.  Prescription pain relievers may be given if deemed necessary by your caregiver. Use only as directed and only as much as you need.  Corticosteroid injections may be given by your caregiver. These injections should be reserved for the most serious cases, because they may only be given a certain number of times. HEAT AND COLD  Cold treatment (icing) relieves pain and reduces inflammation. Cold treatment should be applied for 10 to 15 minutes every 2 to 3 hours for inflammation and pain and immediately after any activity that aggravates your symptoms. Use ice packs or massage the area with a piece of ice (ice massage).  Heat treatment may be used prior to performing the stretching and strengthening activities prescribed by your caregiver, physical therapist, or athletic trainer. Use a heat pack or soak the injury in warm water. SEEK IMMEDIATE MEDICAL CARE IF:  Treatment seems to offer no benefit, or the condition worsens.  Any medications produce adverse side effects. EXERCISES RANGE   OF MOTION (ROM) AND STRETCHING EXERCISES - Plantar Fasciitis (Heel Spur Syndrome) These exercises may help you  when beginning to rehabilitate your injury. Your symptoms may resolve with or without further involvement from your physician, physical therapist or athletic trainer. While completing these exercises, remember:   Restoring tissue flexibility helps normal motion to return to the joints. This allows healthier, less painful movement and activity.  An effective stretch should be held for at least 30 seconds.  A stretch should never be painful. You should only feel a gentle lengthening or release in the stretched tissue. RANGE OF MOTION - Toe Extension, Flexion  Sit with your right / left leg crossed over your opposite knee.  Grasp your toes and gently pull them back toward the top of your foot. You should feel a stretch on the bottom of your toes and/or foot.  Hold this stretch for __________ seconds.  Now, gently pull your toes toward the bottom of your foot. You should feel a stretch on the top of your toes and or foot.  Hold this stretch for __________ seconds. Repeat __________ times. Complete this stretch __________ times per day.  RANGE OF MOTION - Ankle Dorsiflexion, Active Assisted  Remove shoes and sit on a chair that is preferably not on a carpeted surface.  Place right / left foot under knee. Extend your opposite leg for support.  Keeping your heel down, slide your right / left foot back toward the chair until you feel a stretch at your ankle or calf. If you do not feel a stretch, slide your bottom forward to the edge of the chair, while still keeping your heel down.  Hold this stretch for __________ seconds. Repeat __________ times. Complete this stretch __________ times per day.  STRETCH  Gastroc, Standing  Place hands on wall.  Extend right / left leg, keeping the front knee somewhat bent.  Slightly point your toes inward on your back foot.  Keeping your right / left heel on the floor and your knee straight, shift your weight toward the wall, not allowing your back to  arch.  You should feel a gentle stretch in the right / left calf. Hold this position for __________ seconds. Repeat __________ times. Complete this stretch __________ times per day. STRETCH  Soleus, Standing  Place hands on wall.  Extend right / left leg, keeping the other knee somewhat bent.  Slightly point your toes inward on your back foot.  Keep your right / left heel on the floor, bend your back knee, and slightly shift your weight over the back leg so that you feel a gentle stretch deep in your back calf.  Hold this position for __________ seconds. Repeat __________ times. Complete this stretch __________ times per day. STRETCH  Gastrocsoleus, Standing  Note: This exercise can place a lot of stress on your foot and ankle. Please complete this exercise only if specifically instructed by your caregiver.   Place the ball of your right / left foot on a step, keeping your other foot firmly on the same step.  Hold on to the wall or a rail for balance.  Slowly lift your other foot, allowing your body weight to press your heel down over the edge of the step.  You should feel a stretch in your right / left calf.  Hold this position for __________ seconds.  Repeat this exercise with a slight bend in your right / left knee. Repeat __________ times. Complete this stretch __________ times per day.    STRENGTHENING EXERCISES - Plantar Fasciitis (Heel Spur Syndrome)  These exercises may help you when beginning to rehabilitate your injury. They may resolve your symptoms with or without further involvement from your physician, physical therapist or athletic trainer. While completing these exercises, remember:   Muscles can gain both the endurance and the strength needed for everyday activities through controlled exercises.  Complete these exercises as instructed by your physician, physical therapist or athletic trainer. Progress the resistance and repetitions only as guided. STRENGTH - Towel  Curls  Sit in a chair positioned on a non-carpeted surface.  Place your foot on a towel, keeping your heel on the floor.  Pull the towel toward your heel by only curling your toes. Keep your heel on the floor.  If instructed by your physician, physical therapist or athletic trainer, add ____________________ at the end of the towel. Repeat __________ times. Complete this exercise __________ times per day. STRENGTH - Ankle Inversion  Secure one end of a rubber exercise band/tubing to a fixed object (table, pole). Loop the other end around your foot just before your toes.  Place your fists between your knees. This will focus your strengthening at your ankle.  Slowly, pull your big toe up and in, making sure the band/tubing is positioned to resist the entire motion.  Hold this position for __________ seconds.  Have your muscles resist the band/tubing as it slowly pulls your foot back to the starting position. Repeat __________ times. Complete this exercises __________ times per day.  Document Released: 04/19/2005 Document Revised: 07/12/2011 Document Reviewed: 08/01/2008 [employer] Patient Information 2014 [employer]. Plantar Fasciitis Plantar fasciitis is a common condition that causes foot pain. It is soreness (inflammation) of the band of tough fibrous tissue on the bottom of the foot that runs from the heel bone (calcaneus) to the ball of the foot. The cause of this soreness may be from excessive standing, poor fitting shoes, running on hard surfaces, being overweight, having an abnormal walk, or overuse (this is common in runners) of the painful foot or feet. It is also common in aerobic exercise dancers and ballet dancers. SYMPTOMS  Most people with plantar fasciitis complain of:  Severe pain in the morning on the bottom of their foot especially when taking the first steps out of bed. This pain recedes after a few minutes of walking.  Severe pain is experienced also during walking  following a long period of inactivity.  Pain is worse when walking barefoot or up stairs DIAGNOSIS   Your caregiver will diagnose this condition by examining and feeling your foot.  Special tests such as X-rays of your foot, are usually not needed. PREVENTION   Consult a sports medicine professional before beginning a new exercise program.  Walking programs offer a good workout. With walking there is a lower chance of overuse injuries common to runners. There is less impact and less jarring of the joints.  Begin all new exercise programs slowly. If problems or pain develop, decrease the amount of time or distance until you are at a comfortable level.  Wear good shoes and replace them regularly.  Stretch your foot and the heel cords at the back of the ankle (Achilles tendon) both before and after exercise.  Run or exercise on even surfaces that are not hard. For example, asphalt is better than pavement.  Do not run barefoot on hard surfaces.  If using a treadmill, vary the incline.  Do not continue to workout if you have foot or joint   problems. Seek professional help if they do not improve. HOME CARE INSTRUCTIONS   Avoid activities that cause you pain until you recover.  Use ice or cold packs on the problem or painful areas after working out.  Only take over-the-counter or prescription medicines for pain, discomfort, or fever as directed by your caregiver.  Soft shoe inserts or athletic shoes with air or gel sole cushions may be helpful.  If problems continue or become more severe, consult a sports medicine caregiver or your own health care provider. Cortisone is a potent anti-inflammatory medication that may be injected into the painful area. You can discuss this treatment with your caregiver. MAKE SURE YOU:   Understand these instructions.  Will watch your condition.  Will get help right away if you are not doing well or get worse. Document Released: 01/12/2001 Document  Revised: 07/12/2011 Document Reviewed: 03/13/2008 [employer] Patient Information 2014 [employer].  

## 2013-03-23 NOTE — Progress Notes (Signed)
Subjective:     Patient ID: Danny Buck, male   DOB: [birth year], 79 y.o.   MRN: [id number]  Foot Pain   patient presents with daughter stating the back of my left heel has been very sore in my toenails are awful. States it's been about a year and wakes him up in night and he has been revascularized but that has not improved this and he was referred by Dr. Gery Pray  Review of Systems  All other systems reviewed and are negative.       Objective:   Physical Exam  Nursing note and vitals reviewed. Constitutional: He is oriented to person, place, and time.  Neurological: He is oriented to person, place, and time.  Skin: Skin is dry.   diminished circulatory status noted left over right with equinus condition and diminish muscle strength both feet. Patient is found to have pain in the posterior lateral heel A. and severe nail disease 1-5 bilateral with thick subungual debris and pain when pressed     Assessment:    Achilles tendinitis left posterior heel and severe painful mycotic nail infection 1-5 both feet    Plan:     Careful injection after H&P and x-ray of the left heel lateral side 3 mg dexamethasone Kenalog and Xylocaine after first discussing chances for rupture of which they are aware and willing to accept risk. Debridement nailbeds 1-5 both feet with no iatrogenic bleeding noted and will reappoint her recheck

## 2013-03-23 NOTE — Progress Notes (Signed)
  Subjective:    Patient ID: Danny Buck, male    DOB: Dec 26, 1921, 79 y.o.   MRN: [id number]  HPI Comments: N sharp pain  L left plantar heel D over 1 year  O gradual  C it s about the same  A at night after about 4 or 5 hours of sleeping T no treatment for the heel, has had vascular studies and stents in both legs   Foot Pain      Review of Systems  Cardiovascular: Positive for leg swelling.  Musculoskeletal:       Joint pain   All other systems reviewed and are negative.       Objective:   Physical Exam        Assessment & Plan:

## 2013-04-19 ENCOUNTER — Telehealth: Payer: Self-pay | Admitting: *Deleted

## 2013-04-19 ENCOUNTER — Encounter: Payer: Self-pay | Admitting: Cardiovascular Disease

## 2013-04-19 ENCOUNTER — Encounter: Payer: Self-pay | Admitting: Podiatry

## 2013-04-19 NOTE — Telephone Encounter (Signed)
Returned call in response to patient e-mail and pt verified x 2.  RN informed message received and more information needed.  Pt stated he is having pain in his feet.  Pt denied pain of the feet and stated it's just his heels.  RN informed pt that Dr. Allyson Sabal referred him to Dr. Charlsie Merles to evaluate his heel pain and peripheral arterial disease.  Pt stated he did go and was told he has heel spurs.  Stated he is not having anymore surgeries.  Pt stated the shot they gave him worked for a little while, but he is still in pain.  RN asked pt if he has been having any circulation problems and pt denied.  Pt advised to contact Dr. Beverlee Nims office to inform them that he is still in pain to see what his options are.  Pt verbalized understanding and agreed w/ plan.

## 2013-04-20 NOTE — Telephone Encounter (Signed)
Message copied by Chauncey Reading on Fri Apr 20, 2013  7:59 AM ------      Message from: Marella Bile.      Created: Thu Apr 19, 2013  5:39 PM       Agree..patient should follow up with Dr Charlsie Merles.             Thanks!      ----- Message -----         From: Chauncey Reading, RN         Sent: 04/19/2013   5:02 PM           To: Marella Bile, RN            Samara Deist,            Please see the phone notes and e-mails from pt today.  I am not sure who is sending messages on this patient's behalf, but I have talked to him and advised he contact Dr. Charlsie Merles r/t heel pain.  Please review w/ Dr. Allyson Sabal if further action required on our end.  Thanks.            Aleiya Rye M. Lorin Picket, BSN, RN             ------

## 2013-04-24 ENCOUNTER — Telehealth: Payer: Self-pay | Admitting: *Deleted

## 2013-04-24 NOTE — Telephone Encounter (Signed)
I encouraged the pt to make an appt with Dr Charlsie Merles in the [facility] to discuss the continued foot pain and his dtr's request to begin Lyrica a new medication.  Pt request we call his dtr - Arminda Resides to set up the appt.  I referred to the [employer].

## 2013-05-22 ENCOUNTER — Telehealth: Payer: Self-pay | Admitting: *Deleted

## 2013-05-22 LAB — PACEMAKER DEVICE OBSERVATION

## 2013-05-22 NOTE — Telephone Encounter (Signed)
Pt's daughter called again stating no one has gotten back to her yet about her dad's pacer check.

## 2013-05-22 NOTE — Telephone Encounter (Signed)
Daughter aware that [facility] was received.

## 2013-05-22 NOTE — Telephone Encounter (Signed)
Pt's daughter called stating she wanted to know if her father can change his pacer check to today instead of tomorrow.

## 2013-05-23 ENCOUNTER — Encounter: Payer: Medicare Other | Admitting: *Deleted

## 2013-05-23 DIAGNOSIS — I442 Atrioventricular block, complete: Secondary | ICD-10-CM

## 2013-05-23 DIAGNOSIS — Z950 Presence of cardiac pacemaker: Secondary | ICD-10-CM

## 2013-05-23 LAB — MDC_IDC_ENUM_SESS_TYPE_REMOTE
Brady Statistic AS VS Percent: 1 %
Date Time Interrogation Session: 20150120194309
Lead Channel Impedance Value: 402 Ohm
Lead Channel Pacing Threshold Pulse Width: 0.4 ms
Lead Channel Pacing Threshold Pulse Width: 0.4 ms
Lead Channel Sensing Intrinsic Amplitude: 2.8 mV
Lead Channel Setting Pacing Pulse Width: 0.4 ms
Lead Channel Setting Sensing Sensitivity: 2.8 mV
MDC IDC MSMT BATTERY IMPEDANCE: 176 Ohm
MDC IDC MSMT BATTERY REMAINING LONGEVITY: 107 mo
MDC IDC MSMT BATTERY VOLTAGE: 2.78 V
MDC IDC MSMT LEADCHNL RA IMPEDANCE VALUE: 413 Ohm
MDC IDC MSMT LEADCHNL RA PACING THRESHOLD AMPLITUDE: 0.5 V
MDC IDC MSMT LEADCHNL RV PACING THRESHOLD AMPLITUDE: 0.625 V
MDC IDC SET LEADCHNL RA PACING AMPLITUDE: 1.5 V
MDC IDC SET LEADCHNL RV PACING AMPLITUDE: 2 V
MDC IDC STAT BRADY AP VP PERCENT: 79 %
MDC IDC STAT BRADY AP VS PERCENT: 1 %
MDC IDC STAT BRADY AS VP PERCENT: 20 %

## 2013-06-06 ENCOUNTER — Encounter: Payer: Self-pay | Admitting: *Deleted

## 2013-06-07 ENCOUNTER — Encounter: Payer: Self-pay | Admitting: *Deleted

## 2013-06-22 ENCOUNTER — Ambulatory Visit: Payer: Medicare Other | Admitting: Podiatry

## 2013-06-29 ENCOUNTER — Encounter: Payer: Self-pay | Admitting: Podiatry

## 2013-06-29 ENCOUNTER — Ambulatory Visit (INDEPENDENT_AMBULATORY_CARE_PROVIDER_SITE_OTHER): Payer: Medicare Other | Admitting: Podiatry

## 2013-06-29 VITALS — BP 136/69 | HR 89 | Resp 16

## 2013-06-29 DIAGNOSIS — I739 Peripheral vascular disease, unspecified: Secondary | ICD-10-CM

## 2013-06-29 DIAGNOSIS — B351 Tinea unguium: Secondary | ICD-10-CM

## 2013-06-29 DIAGNOSIS — M79609 Pain in unspecified limb: Secondary | ICD-10-CM

## 2013-06-29 MED ORDER — HYDROCODONE-ACETAMINOPHEN 10-325 MG PO TABS: 1.0000 | ORAL_TABLET | Freq: Every day | ORAL | Status: DC

## 2013-06-29 MED ORDER — GABAPENTIN 400 MG PO CAPS: 400.0000 mg | ORAL_CAPSULE | Freq: Two times a day (BID) | ORAL | Status: DC

## 2013-06-29 NOTE — Progress Notes (Signed)
Follow up on the heel pain and its been 3 months for the toenails

## 2013-06-30 ENCOUNTER — Encounter: Payer: Self-pay | Admitting: Cardiovascular Disease

## 2013-07-01 NOTE — Progress Notes (Signed)
Subjective:     Patient ID: Danny Buck, male   DOB: March 06, 1922, 80 y.o.   MRN: [id number]  HPI patient presents with nail disease and pain 1-5 both feet   Review of Systems     Objective:   Physical Exam Neurovascular status intact and unchanged and thick nailbeds with pain 1-5 both feet    Assessment:     Mycotic nail infection with pain 1-5 of both feet    Plan:     Debridement painful nail bed 1-5 both feet with no iatrogenic bleeding noted

## 2013-07-02 NOTE — Telephone Encounter (Signed)
Message forwarded to Scheduling to contact pt and schedule appt.

## 2013-07-05 ENCOUNTER — Ambulatory Visit: Payer: Self-pay | Admitting: Cardiovascular Disease

## 2013-07-09 ENCOUNTER — Ambulatory Visit (INDEPENDENT_AMBULATORY_CARE_PROVIDER_SITE_OTHER): Payer: Medicare Other | Admitting: Cardiovascular Disease

## 2013-07-09 ENCOUNTER — Encounter: Payer: Self-pay | Admitting: Cardiovascular Disease

## 2013-07-09 VITALS — BP 146/68 | HR 84 | Ht 70.0 in | Wt 203.8 lb

## 2013-07-09 DIAGNOSIS — I739 Peripheral vascular disease, unspecified: Secondary | ICD-10-CM

## 2013-07-09 MED ORDER — CILOSTAZOL 50 MG PO TABS: 50.0000 mg | ORAL_TABLET | Freq: Two times a day (BID) | ORAL | Status: DC

## 2013-07-09 NOTE — Patient Instructions (Signed)
Your physician wants you to follow-up in: 3 months with Dr Gwenlyn Found. You will receive a reminder letter in the mail two months in advance. If you don't receive a letter, please call our office to schedule the follow-up appointment.   Dr Gwenlyn Found has prescribed Pletal 50mg  twice a day to improve circulation in legs.  We have placed an order through [facility] for a hospital bed.

## 2013-07-09 NOTE — Assessment & Plan Note (Addendum)
I have performed staged bilateral SFA diamondback orbital initial atherectomy, PT and stenting with known one-vessel runoff imperial arteries bilaterally. His followup Dopplers November revealed ABIs of 0.9 range. He does not complaining of claudication but does have nocturnal pain better when he dangles his foot off the bed which may be related to his circulatory issues. After reviewing the angiograms I do not think he has a percutaneous option of revascularization antegrade. I'm going to begin him on Pletal 50 mg by mouth twice a day I will see him back in 3 months.the patient has requested a hospital bed to facilitate having deep below his chest to potentially improve leg pain. I think this is a reasonable plan.

## 2013-07-09 NOTE — Progress Notes (Addendum)
07/09/2013 Danny Buck   08-09-1921  [id number]  Primary Physician Pcp Not In System Primary Cardiologist: Lorretta Harp MD Renae Gloss   HPI:  80 year old  7 male patient of Dr. Dr. Victorino December and Hilty's who was referred for evaluation of claudication/critical limb ischemia. He has a history of complete complete heart block and subsequent pacemaker insertion as well as essential hypertension. He had LEA dopplers Dopplers performed in our office 08/14/12 revealing ABI 0.5-0.6 range with high-grade SFA and tibial vessel disease. While he does not complain of significant claudication he does complain of nocturnal leg pain while recumbent which improved when he puts his legs over the side of the bed which aren't typical for critical limb ischemia. The patient underwent diamondback orbital rotational atherectomy of his mid right SFA with stenting using an IDEV stent as well as angioplastyof his popliteal artery by Dr. Gwenlyn Found on 12/11/12. His right ABI increased from 0.51-0.85 with claudication improved as well. On 02/13/13 he underwent staged left SFA diamondback orbital rotational atherectomy, PTA and stenting with excellent angiographic result. He did have one vessel runoff via the peroneal. His ABI increased to 0.91 from 0.66 initially. He still has nocturnal left heel pain. He continued to complain of nocturnal pain improve he improved dangling his feet off the side of the bed. This may be related to circulatory insufficiency given his one vessel runoff bilaterally. I do not think he is a candidate for percutaneous exacerbation of his tibial vessels.   Current Outpatient Prescriptions  Medication Sig Dispense Refill  . aspirin EC 81 MG tablet Take 81 mg by mouth daily.      . clopidogrel (PLAVIX) 75 MG tablet Take 1 tablet (75 mg total) by mouth daily with breakfast.  30 tablet  11  . ferrous sulfate 325 (65 FE) MG tablet Take 325 mg by mouth daily with breakfast.       . gabapentin (NEURONTIN) 400 MG capsule Take 1 capsule (400 mg total) by mouth 2 (two) times daily.  60 capsule  4  . HYDROcodone-acetaminophen (NORCO) 10-325 MG per tablet Take 1 tablet by mouth daily after supper.  30 tablet  0  . lisinopril-hydrochlorothiazide (PRINZIDE,ZESTORETIC) 20-25 MG per tablet Take 1 tablet by mouth daily.      . cilostazol (PLETAL) 50 MG tablet Take 1 tablet (50 mg total) by mouth 2 (two) times daily.  60 tablet  6   No current facility-administered medications for this visit.    No Known Allergies  History   Social History  . Marital Status: Widowed    Spouse Name: N/A    Number of Children: 8  . Years of Education: N/A   Occupational History  .     Social History Main Topics  . Smoking status: Former Smoker    Quit date: 07/12/1962  . Smokeless tobacco: Never Used     Comment: quit in 1964  . Alcohol Use: No  . Drug Use: No  . Sexual Activity: Not on file   Other Topics Concern  . Not on file   Social History Narrative  . No narrative on file     Review of Systems: General: negative for chills, fever, night sweats or weight changes.  Cardiovascular: negative for chest pain, dyspnea on exertion, edema, orthopnea, palpitations, paroxysmal nocturnal dyspnea or shortness of breath Dermatological: negative for rash Respiratory: negative for cough or wheezing Urologic: negative for hematuria Abdominal: negative for nausea, vomiting, diarrhea, bright red blood per rectum, melena,  or hematemesis Neurologic: negative for visual changes, syncope, or dizziness All other systems reviewed and are otherwise negative except as noted above.    Blood pressure 146/68, pulse 84, height 5\' 10"  (1.778 m), weight 92.443 kg (203 lb 12.8 oz).  General appearance: alert and no distress Neck: no adenopathy, no carotid bruit, no JVD, supple, symmetrical, trachea midline and thyroid not enlarged, symmetric, no tenderness/mass/nodules Lungs: clear to  auscultation bilaterally Heart: regular rate and rhythm, S1, S2 normal, no murmur, click, rub or gallop Extremities: extremities normal, atraumatic, no cyanosis or edema  EKG not performed today  ASSESSMENT AND PLAN:   PAD (peripheral artery disease) I have performed staged bilateral SFA diamondback orbital initial atherectomy, PT and stenting with known one-vessel runoff imperial arteries bilaterally. His followup Dopplers November revealed ABIs of 0.9 range. He does not complaining of claudication but does have nocturnal pain better when he dangles his foot off the bed which may be related to his circulatory issues. After reviewing the angiograms I do not think he has a percutaneous option of revascularization antegrade. I'm going to begin him on Pletal 50 mg by mouth twice a day I will see him back in 3 months.  Patient suffers from chronic lower extremity pain which is caused by PAD. A semi-electric Hospital bed will decrease his pain allowing him to sleep more and position his legs in a manner that a normal bed cannot. A hospital bed will alleviate his nocturnal pain allowing him to sleep more. This patient requires frequent changes in body position which cannot be achieved with a normal bed.  Lorretta Harp MD FACP,FACC,FAHA, [facility] 07/09/2013 2:57 PM

## 2013-07-12 ENCOUNTER — Encounter: Payer: Self-pay | Admitting: Cardiovascular Disease

## 2013-07-16 ENCOUNTER — Encounter: Payer: Self-pay | Admitting: Cardiovascular Disease

## 2013-07-16 ENCOUNTER — Encounter: Payer: Self-pay | Admitting: *Deleted

## 2013-07-19 ENCOUNTER — Ambulatory Visit (HOSPITAL_COMMUNITY)
Admission: RE | Admit: 2013-07-19 | Discharge: 2013-07-19 | Disposition: A | Discharge status: Home / Self Care | Payer: Medicare Other | Source: Ambulatory Visit | Attending: Cardiovascular Disease | Admitting: Cardiovascular Disease

## 2013-07-19 DIAGNOSIS — I70219 Atherosclerosis of native arteries of extremities with intermittent claudication, unspecified extremity: Secondary | ICD-10-CM

## 2013-07-19 DIAGNOSIS — I739 Peripheral vascular disease, unspecified: Secondary | ICD-10-CM

## 2013-07-19 NOTE — Progress Notes (Signed)
Arterial Lower Ext. Completed. Danny Buck, BS, RDMS, RVT

## 2013-07-20 ENCOUNTER — Encounter (HOSPITAL_COMMUNITY): Payer: Medicare Other

## 2013-07-31 ENCOUNTER — Telehealth: Payer: Self-pay | Admitting: *Deleted

## 2013-07-31 DIAGNOSIS — I739 Peripheral vascular disease, unspecified: Secondary | ICD-10-CM

## 2013-07-31 NOTE — Telephone Encounter (Signed)
Message copied by Chauncy Lean on Tue Jul 31, 2013 10:51 AM ------      Message from: Lorretta Harp      Created: Sun Jul 29, 2013 12:50 PM       No change from prior study. Repeat in 12 months. ------

## 2013-07-31 NOTE — Telephone Encounter (Signed)
Order placed for repeat lower extremity arterial doppler in 1 year  

## 2013-08-06 ENCOUNTER — Telehealth: Payer: Self-pay | Admitting: Cardiovascular Disease

## 2013-08-06 ENCOUNTER — Other Ambulatory Visit (HOSPITAL_COMMUNITY): Payer: Self-pay | Admitting: Cardiovascular Disease

## 2013-08-06 ENCOUNTER — Encounter: Payer: Self-pay | Admitting: Cardiovascular Disease

## 2013-08-06 DIAGNOSIS — I739 Peripheral vascular disease, unspecified: Secondary | ICD-10-CM

## 2013-08-23 ENCOUNTER — Ambulatory Visit (INDEPENDENT_AMBULATORY_CARE_PROVIDER_SITE_OTHER): Payer: Medicare Other | Admitting: *Deleted

## 2013-08-23 ENCOUNTER — Encounter: Payer: Self-pay | Admitting: Cardiovascular Disease

## 2013-08-23 DIAGNOSIS — I442 Atrioventricular block, complete: Secondary | ICD-10-CM

## 2013-08-23 NOTE — Telephone Encounter (Signed)
Closed encounter °

## 2013-08-27 ENCOUNTER — Encounter: Payer: Self-pay | Admitting: Cardiovascular Disease

## 2013-08-27 NOTE — Telephone Encounter (Signed)
Previous message sent to [facility] location.

## 2013-08-27 NOTE — Telephone Encounter (Signed)
Message forwarded to the Device Pool at the [address] location.  

## 2013-08-30 ENCOUNTER — Encounter: Payer: Self-pay | Admitting: *Deleted

## 2013-08-30 LAB — MDC_IDC_ENUM_SESS_TYPE_REMOTE
Battery Remaining Longevity: 100 mo
Battery Voltage: 2.78 V
Brady Statistic AP VP Percent: 70 %
Brady Statistic AP VS Percent: 1 %
Brady Statistic AS VP Percent: 29 %
Lead Channel Impedance Value: 412 Ohm
Lead Channel Pacing Threshold Amplitude: 0.5 V
Lead Channel Pacing Threshold Amplitude: 0.625 V
Lead Channel Sensing Intrinsic Amplitude: 2.8 mV
Lead Channel Setting Pacing Amplitude: 1.5 V
Lead Channel Setting Pacing Amplitude: 2 V
Lead Channel Setting Pacing Pulse Width: 0.4 ms
Lead Channel Setting Sensing Sensitivity: 2.8 mV
MDC IDC MSMT BATTERY IMPEDANCE: 226 Ohm
MDC IDC MSMT LEADCHNL RA PACING THRESHOLD PULSEWIDTH: 0.4 ms
MDC IDC MSMT LEADCHNL RV IMPEDANCE VALUE: 399 Ohm
MDC IDC MSMT LEADCHNL RV PACING THRESHOLD PULSEWIDTH: 0.4 ms
MDC IDC SESS DTM: 20150423140608
MDC IDC STAT BRADY AS VS PERCENT: 1 %

## 2013-09-04 NOTE — Progress Notes (Signed)
Pacemaker remote check. Device function reviewed. Impedance, sensing, auto capture thresholds consistent with previous measurements. Histograms appropriate for patient and level of activity. All other diagnostic data reviewed and is appropriate and stable for patient. Real time/magnet EGM shows appropriate sensing and capture. 982 mode switches the longest > 12 minutes for a total burden of 0.2%, + plavix.  11 ventricular high rate episodes 3-9 seconds. Estimated longevity 8.5 years. Plan to follow in 3 months remotely, to see in office annually.  ROV in July with Dr. Sallyanne Kuster.

## 2013-09-18 ENCOUNTER — Telehealth: Payer: Self-pay | Admitting: Cardiovascular Disease

## 2013-09-18 NOTE — Telephone Encounter (Signed)
I spoke with the patient regarding the referral for a hospital bed that Dr. Gwenlyn Found recommended.  The patient states that he cancelled this.

## 2013-09-21 ENCOUNTER — Ambulatory Visit: Payer: Medicare Other | Admitting: Podiatry

## 2013-10-09 ENCOUNTER — Ambulatory Visit: Payer: Medicare Other | Admitting: Cardiovascular Disease

## 2013-10-10 ENCOUNTER — Ambulatory Visit (INDEPENDENT_AMBULATORY_CARE_PROVIDER_SITE_OTHER): Payer: Medicare Other | Admitting: Cardiology

## 2013-10-10 ENCOUNTER — Encounter: Payer: Self-pay | Admitting: Cardiology

## 2013-10-10 VITALS — BP 120/70 | HR 88 | Ht 71.0 in | Wt 198.5 lb

## 2013-10-10 DIAGNOSIS — R55 Syncope and collapse: Secondary | ICD-10-CM

## 2013-10-10 DIAGNOSIS — I471 Supraventricular tachycardia: Secondary | ICD-10-CM

## 2013-10-10 DIAGNOSIS — R053 Chronic cough: Secondary | ICD-10-CM | POA: Insufficient documentation

## 2013-10-10 DIAGNOSIS — I472 Ventricular tachycardia: Secondary | ICD-10-CM

## 2013-10-10 DIAGNOSIS — I739 Peripheral vascular disease, unspecified: Secondary | ICD-10-CM

## 2013-10-10 DIAGNOSIS — R600 Localized edema: Secondary | ICD-10-CM

## 2013-10-10 DIAGNOSIS — R059 Cough, unspecified: Secondary | ICD-10-CM

## 2013-10-10 DIAGNOSIS — R0609 Other forms of dyspnea: Secondary | ICD-10-CM

## 2013-10-10 DIAGNOSIS — I4719 Other supraventricular tachycardia: Secondary | ICD-10-CM

## 2013-10-10 DIAGNOSIS — R05 Cough: Secondary | ICD-10-CM

## 2013-10-10 DIAGNOSIS — R609 Edema, unspecified: Secondary | ICD-10-CM

## 2013-10-10 DIAGNOSIS — I10 Essential (primary) hypertension: Secondary | ICD-10-CM

## 2013-10-10 DIAGNOSIS — I4729 Other ventricular tachycardia: Secondary | ICD-10-CM

## 2013-10-10 DIAGNOSIS — R0989 Other specified symptoms and signs involving the circulatory and respiratory systems: Secondary | ICD-10-CM

## 2013-10-10 DIAGNOSIS — Z950 Presence of cardiac pacemaker: Secondary | ICD-10-CM

## 2013-10-10 MED ORDER — LOSARTAN POTASSIUM 50 MG PO TABS: 50.0000 mg | ORAL_TABLET | Freq: Every day | ORAL | Status: DC

## 2013-10-10 MED ORDER — FUROSEMIDE 20 MG PO TABS: 20.0000 mg | ORAL_TABLET | Freq: Every day | ORAL | Status: DC

## 2013-10-10 NOTE — Assessment & Plan Note (Addendum)
S/p Bilateral SFA PTA-Stent - patent stents by recent dopplers.  Claudication has improved, but with 1 V runoff, still has decreased pedal circulation. On Pletal.

## 2013-10-10 NOTE — Assessment & Plan Note (Signed)
Mostly dependent - combination of PAD & probably venous insufficiency.  No CHF Sx, no JVD.  Plan: Lasix 20 mg daily; support Hose, elevate feet

## 2013-10-10 NOTE — Progress Notes (Signed)
PATIENTTrasean Buck MRN: [id number]  DOB: 03/29/1922   DOV:10/13/2013 PCP: Pcp Not In System  Clinic Note: Chief Complaint  Patient presents with  . other     C/o elevated BP and edema ankles. Meds reviewed verbally with pt.   HPI: Danny Buck is a 80 y.o.  male with a PMH below who presents today for what amounts to be a 1 year follow-up.  He had been seen by both Drs. Hilty and Croitoru at the former [facility] and [place] office with her last appointment in May 2014. He has followed up with Dr. Sallyanne Kuster for his pacemaker since then. He is also seen Dr. Gwenlyn Found for his PAD -- ABIs as of last year were 0.5 on right and 0.66 on the left with a lateral SFA near occlusion. Despite this he did not note claudication but was referred for evaluation due to the severity of the ABIs. 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 He has a history of PAD, hypertension as well as complete heart block status post pacemaker placement (in [place], [place]). He is mostly A sensed with V pacing. His major complaint at the time of seeing Dr. Debara Pickett last year was exertional dyspnea that was evaluated with a Myoview stress test Demonstrating an EF of 64% with mild diaphragmatic attenuation and mild apical hypokinesis with no evidence of ischemia but likely small inferoapical infarction. He saw Dr. Sallyanne Kuster last in July of 2014 for pacemaker check that revealed normal pacemaker function with asymptomatic Nonsustained V. tach as well as PAT. He was also noted to have poorly controlled hypertension. Last device check was in remote check in May. He is due to repeat followup with Dr. Sallyanne Kuster in July.   For his bilateral SFA disease he had peripheral angiography which revealed highly calcified segmental mid SFA stenosis bilaterally with one vessel runoff via peroneal artery. Dr. Gwenlyn Found performed the  [facility] atherectomy and IDEV (6.5 mm x 100 mm) stenting of the mid RSFA& balloon angioplasty of the popliteal artery in August 2014. Then in October he underwent staged intervention on the left SFA with diamondback orbital atherectomy with PTA and stenting (6.5 mm and one 20 mm Supera-self expanding stent). ABIs improved to 0.95 on the Right and 0.91 on the Left as of November 2014 12 reduced to 0.68 and 0.77 (right and left) as of March 2015 point stents were noted to be patent -- reduced ABIs but no change in flow velocities. He slowly has one vessel runoff bilaterally, therefore Dr. Gwenlyn Found has placed him on Pletal.  Interval History: [name] presents today to our [place] office to establish care here. He lives in [place], [place] which is closer to [place] then [place]. He will is a doing fairly well, but has noted worsening lower STEMI edema and more difficult control blood pressure of late. He also has a frequent hacking cough. One month ago he was having a coughing spell and essentially was thought to have passed out. He doesn't know for sure, but thinks he may have lost consciousness for hold it. He denied any rapid or irregular heart beats associated with it. He has not really noted any rapid or irregular heartbeats, but was found to have some nonsustained V. tach on pacemaker interrogation. He denies any chest tightness or pressure with rest or exertion, simply notes that he has become more more deconditioned over  last year or so and is not as active as he used to be. Therefore he notes stable exertional dyspnea. He has about 2-3+ pitting edema bilaterally but denies any claudication. He has a lot of nervous twitching in his legs at night. He denies any PND orthopnea or other heart failure symptoms. No TIA or amaurosis fugax symptoms. No melena, hematochezia or hematuria. He still does not complain of claudication.   On his last remote check he did have 11-rate episodes of NSVT lasting  3-9 seconds.  Past Medical History  Diagnosis Date  . Peripheral arterial disease     CT angiogram 08/26/2011 showed reduced ABIs bilaterally with ABI of 0.54on the right and 0.72 on the left -- s/p Bilat SFA Atherectomy & PTA-Stents (Dr. Gwenlyn Found);; Dopplers 07/2103 - RABI 0.68; LABI 0.77; patent SFA stents, Bilat ATA& PTA occlusion w/ 1 V runnoff (Peroneal A)  . Hypertension   . Complete heart block     s/p PPM  . Pacemaker   . Hypertension   . Arthritis     Prior Cardiac Evaluation and Past Surgical History: Past Surgical History  Procedure Laterality Date  . Knee surgery    . Pacemaker insertion  09/09/2011    Adapta Versa DR Dual chamber  [employer]  last checked 11/10/2011,   . Insert / replace / remove pacemaker    . Femoral artery stent  8 & 01/2013    Bilateral SFA Rotational Atherectomy & Stent.  . Nm myoview ltd  01/2013    Small Inferoapical infarct; No ischemia; Normal EF  . Transthoracic echocardiogram  09/2011    EF 60%, otherwise normal    No Known Allergies  Current Outpatient Prescriptions  Medication Sig Dispense Refill  . aspirin EC 81 MG tablet Take 81 mg by mouth daily.      . cilostazol (PLETAL) 50 MG tablet Take 1 tablet (50 mg total) by mouth 2 (two) times daily.  60 tablet  6  . ferrous sulfate 325 (65 FE) MG tablet Take 325 mg by mouth daily with breakfast.      . furosemide (LASIX) 20 MG tablet Take 1 tablet (20 mg total) by mouth daily.  90 tablet  3  . losartan (COZAAR) 50 MG tablet Take 1 tablet (50 mg total) by mouth daily.  90 tablet  3   No current facility-administered medications for this visit.    History   Social History Narrative  . No narrative on file   ROS: A comprehensive Review of Systems - Negative except Symptoms noted in history of present illness  PHYSICAL EXAM BP 120/70  Pulse 88  Ht 5\' 11"  (1.803 m)  Wt 198 lb 8 oz (90.039 kg)  BMI 27.70 kg/m2 General appearance: alert, cooperative, appears stated age, no distress and  Pleasant mood and affect. Neck: no adenopathy, no carotid bruit, no JVD, supple, symmetrical, trachea midline and . He remains but not distended Lungs: clear to auscultation bilaterally, normal percussion bilaterally and Nonlabored, good air movement Heart: normal apical impulse and RRR, normal S1 and S2 with a soft systolic murmur (1/6) along the sternal border. No other R./G. Abdomen: soft, non-tender; bowel sounds normal; no masses,  no organomegaly Extremities: edema 2+ pitting to the mid calf, venous stasis dermatitis noted and Dry scaly skin consistent with PAD Pulses: Faint, barely palpable bilateral pedal pulses Neurologic: Grossly normal   Adult ECG Report  Rate: 88 ;  Rhythm: A-sensed, V.-paced and RBBB pattern  Stable EKG   Recent  Labs: None   ASSESSMENT / PLAN: Bilateral leg edema Mostly dependent - combination of PAD & probably venous insufficiency.  No CHF Sx, no JVD.  Plan: Lasix 20 mg daily; support Hose, elevate feet  Persistent dry cough This could be related to ACE-I; convert ACE-I to ARB (Losartan 50mg )  Presence of permanent cardiac pacemaker Remote monitoring.  Will need to be scheduled in [facility] (pt preference - coming to [facility] is closer)  PAD (peripheral artery disease) S/p Bilateral SFA PTA-Stent - patent stents by recent dopplers.  Claudication has improved, but with 1 V runoff, still has decreased pedal circulation. On Pletal.  Essential hypertension Currently well-controlled. We'll need to see how it changes with the conversion of ACE inhibitor to ARB.  Nonsustained ventricular tachycardia I am not sure if this could be the reason why he has "blacking out spell".  Thankfully he is no longer driving. He has not had a decreased ejection fraction but does have a small infarct on Myoview. He may not correlate exactly, but he did have some nonsustained V. tach on interrogation remotely in May but for 3-9 seconds which should not cause  syncope.  Syncope One event about a month ago. Based on what it sounds like it was related more to his coughing leading to a vagal response. I don't think he denies second spell of NSVT would lead to syncope.  Paroxysmal atrial tachycardia Well-tolerated, we are currently avoiding beta blockers with his PAD. As long as he is not symptomatic, migration would be to avoid beta blockers for now.  DOE (dyspnea on exertion) Not as much an active problem now. He was evaluated with a Myoview last year which was negative for ischemia. I don't think any further evaluation is required at this time. Hopefully if he has some mild diastolic failure the Lasix will help.    Orders Placed This Encounter  Procedures  . EKG 12-Lead   Meds ordered this encounter  Medications  . losartan (COZAAR) 50 MG tablet    Sig: Take 1 tablet (50 mg total) by mouth daily.    Dispense:  90 tablet    Refill:  3  . furosemide (LASIX) 20 MG tablet    Sig: Take 1 tablet (20 mg total) by mouth daily.    Dispense:  90 tablet    Refill:  3   Followup: 2-3 months   DAVID W. Ellyn Hack, M.D., M.S. Interventional Cardiology [facility]

## 2013-10-10 NOTE — Assessment & Plan Note (Signed)
Remote monitoring.  Will need to be scheduled in [facility] (pt preference - coming to [place] is closer)

## 2013-10-10 NOTE — Assessment & Plan Note (Signed)
This could be related to ACE-I; convert ACE-I to ARB (Losartan 50mg )

## 2013-10-10 NOTE — Patient Instructions (Signed)
Your physician has recommended you make the following change in your medication:  Stop Lisinopril  Start Losartan 50 mg daily  Start Lasix 20 mg daily   Your physician recommends that you schedule a follow-up appointment in:  2-3 months

## 2013-10-13 DIAGNOSIS — R55 Syncope and collapse: Secondary | ICD-10-CM | POA: Insufficient documentation

## 2013-10-13 NOTE — Assessment & Plan Note (Signed)
Not as much an active problem now. He was evaluated with a Myoview last year which was negative for ischemia. I don't think any further evaluation is required at this time. Hopefully if he has some mild diastolic failure the Lasix will help.

## 2013-10-13 NOTE — Assessment & Plan Note (Signed)
Currently well-controlled. We'll need to see how it changes with the conversion of ACE inhibitor to ARB.

## 2013-10-13 NOTE — Assessment & Plan Note (Signed)
One event about a month ago. Based on what it sounds like it was related more to his coughing leading to a vagal response. I don't think he denies second spell of NSVT would lead to syncope.

## 2013-10-13 NOTE — Assessment & Plan Note (Addendum)
I am not sure if this could be the reason why he has "blacking out spell".  Thankfully he is no longer driving. He has not had a decreased ejection fraction but does have a small infarct on Myoview. He may not correlate exactly, but he did have some nonsustained V. tach on interrogation remotely in May but for 3-9 seconds which should not cause syncope.

## 2013-10-13 NOTE — Assessment & Plan Note (Signed)
Well-tolerated, we are currently avoiding beta blockers with his PAD. As long as he is not symptomatic, migration would be to avoid beta blockers for now.

## 2013-10-29 IMAGING — CR DG CHEST 2V
2 series · 2 of 2 positions shown · non-contrast
Comparison: None.

CLINICAL DATA: Cough, preop for stent placement, former smoking
history

CHEST - 2 VIEW

[w chest pa]
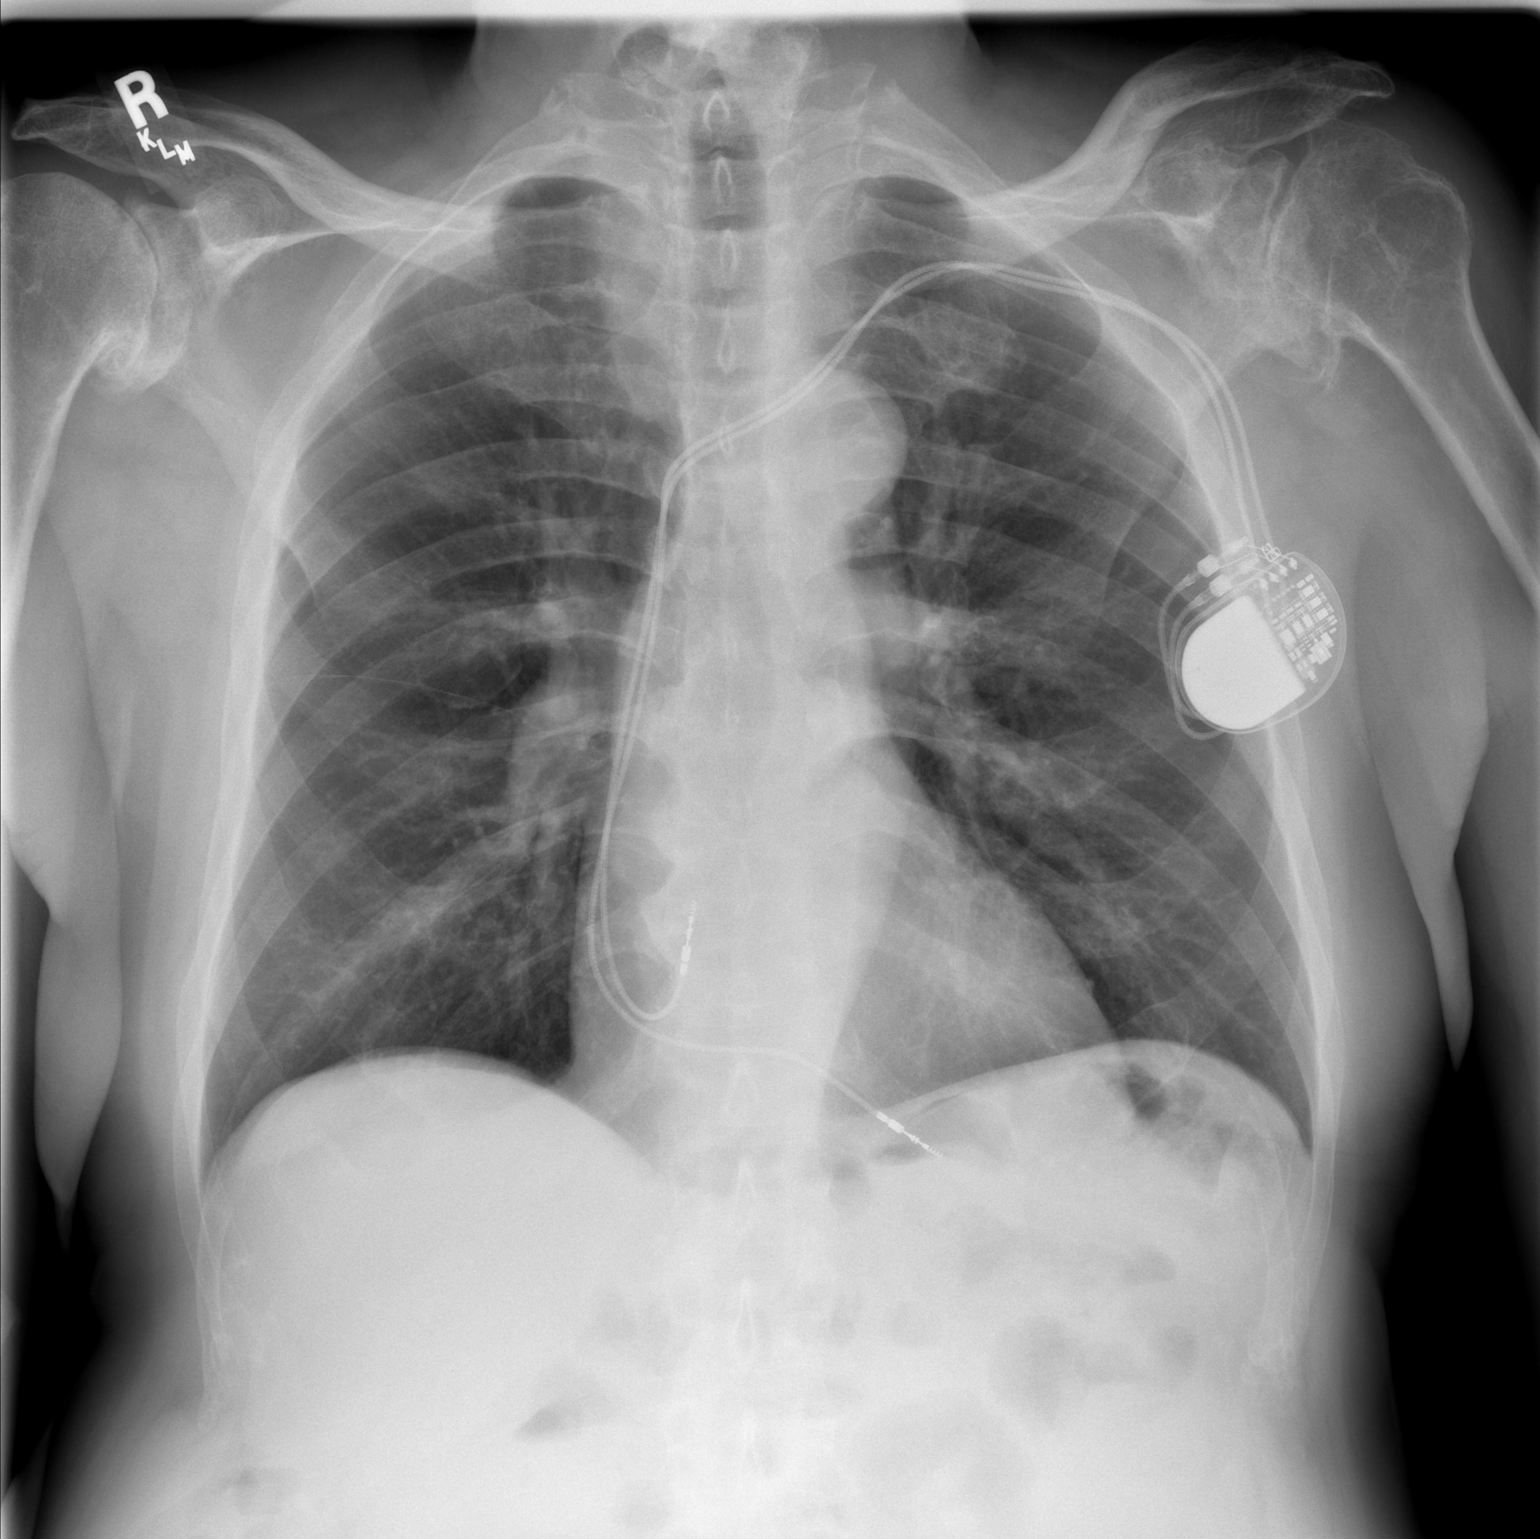

[w chest lat]
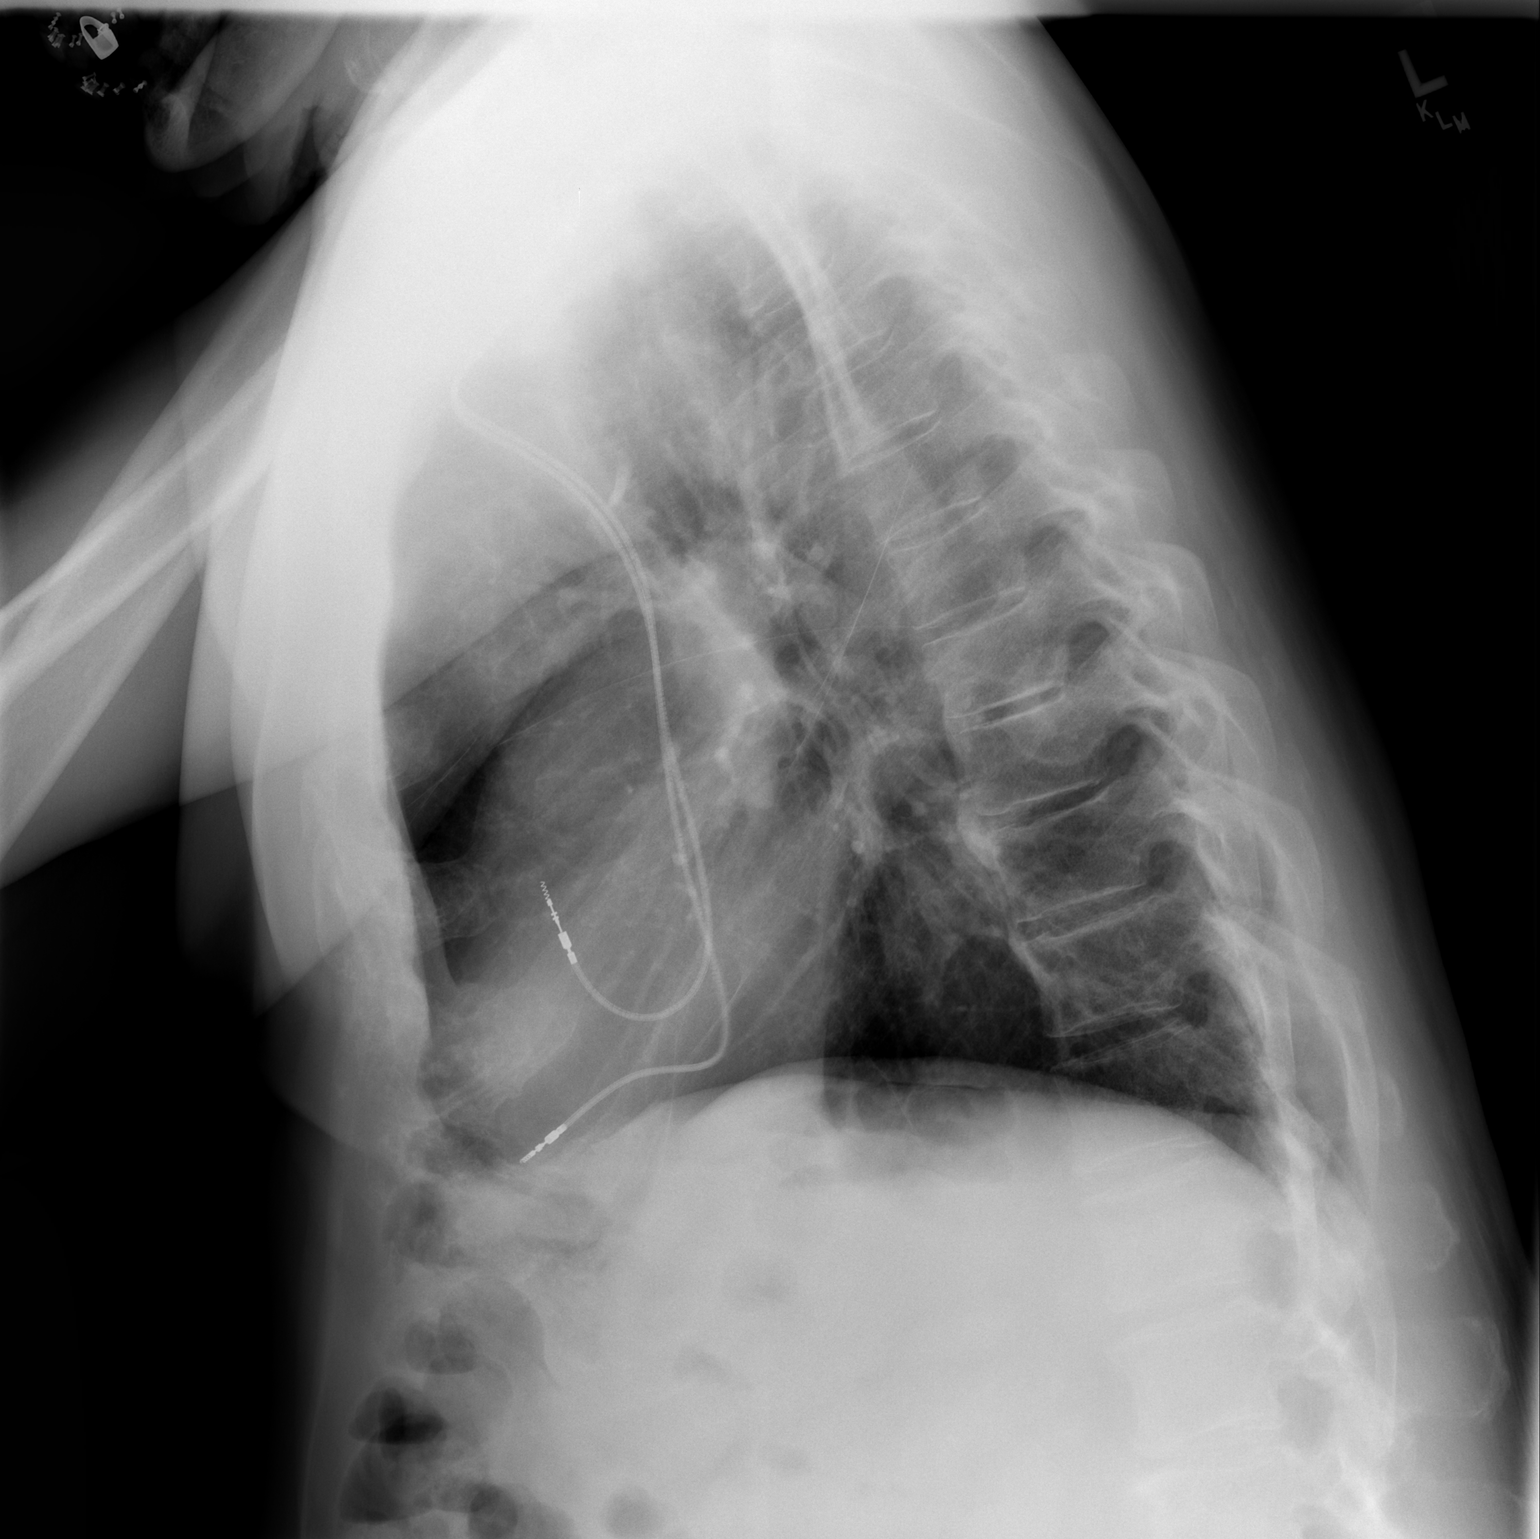

[2 of 2 positions shown; findings below may reference images not displayed]

FINDINGS: No active infiltrate or effusion is seen.  Mediastinal
contours appear normal.  The heart is within normal limits in size.
A dual lead permanent pacemaker is present.  There are degenerative
changes throughout the thoracic spine and in both shoulders.
IMPRESSION: No active lung disease.  Permanent pacemaker.  Diffuse degenerative
change

## 2013-11-27 ENCOUNTER — Telehealth: Payer: Self-pay | Admitting: Cardiology

## 2013-11-27 ENCOUNTER — Telehealth: Payer: Self-pay

## 2013-11-27 NOTE — Telephone Encounter (Signed)
Ms Danny Buck is calling because [name] is having side effects from the lasix, swelling of the legs and some pain. She would like to know if he needs to stop taking med or what. Please call  Thanks

## 2013-11-27 NOTE — Telephone Encounter (Signed)
Pt daughter called states that pt had surgery on his legs last year, and his feet are starting to swell . Please call.

## 2013-11-27 NOTE — Telephone Encounter (Signed)
SPOKE TO DAUGHTER.RN INFORMED DAUGHTER CALLING FROM [place] OFFICE. Marland Kitchen SHE STATES PATIENT HAS AN APPOINTMENT ON Friday AT THE [facility] OFFICE. RN INFORMED HER TO KEEP APPOINTMENT  VERBALIZED Danny Buck

## 2013-11-28 NOTE — Telephone Encounter (Signed)
Can increase Lasix to 40mg  x 2-3 days to get back to baseline.  Leonie Man, MD

## 2013-11-28 NOTE — Telephone Encounter (Signed)
Informed patient of Dr. Darcus Pester response  Patient verbalized understanding  Instructed patient to come to his 7/31 appt as planned

## 2013-11-28 NOTE — Telephone Encounter (Signed)
Spoke with patients daughter She stated his feet have been swelling  She stated patient is following a low sodium  Denies shortness of breath  Patient scheduled to see Christell Faith PA 11/30/13  Instructed patients daughter to call or visit the ED if she feels his situation becomes emergent

## 2013-11-30 ENCOUNTER — Encounter: Payer: Self-pay | Admitting: Physician Assistant

## 2013-11-30 ENCOUNTER — Ambulatory Visit (INDEPENDENT_AMBULATORY_CARE_PROVIDER_SITE_OTHER): Payer: Medicare Other | Admitting: Physician Assistant

## 2013-11-30 VITALS — BP 140/72 | HR 68 | Ht 70.0 in | Wt 196.8 lb

## 2013-11-30 DIAGNOSIS — R05 Cough: Secondary | ICD-10-CM

## 2013-11-30 DIAGNOSIS — I10 Essential (primary) hypertension: Secondary | ICD-10-CM

## 2013-11-30 DIAGNOSIS — I442 Atrioventricular block, complete: Secondary | ICD-10-CM

## 2013-11-30 DIAGNOSIS — R059 Cough, unspecified: Secondary | ICD-10-CM

## 2013-11-30 DIAGNOSIS — I739 Peripheral vascular disease, unspecified: Secondary | ICD-10-CM

## 2013-11-30 DIAGNOSIS — R609 Edema, unspecified: Secondary | ICD-10-CM

## 2013-11-30 DIAGNOSIS — R600 Localized edema: Secondary | ICD-10-CM

## 2013-11-30 MED ORDER — AMLODIPINE BESYLATE 5 MG PO TABS: 5.0000 mg | ORAL_TABLET | Freq: Every day | ORAL | Status: DC

## 2013-11-30 NOTE — Progress Notes (Signed)
Patient Name: Danny, Buck Feb 29, 1922, MRN [id number]  Date of Encounter: 11/30/2013  Primary Care Provider:  Pcp Not In System Primary Cardiologist:  Dr. Gwenlyn Found, MD  Patient Profile:  80 y.o. male with history of peripheral arterial disease s/p bilateral SFA atherectomy and PTA stents (dopplers 07/2013 RABI 0.68; LABI 0.77; patent stents, bilateral ATA and PTA occlusion with 1 vessel runoff), CHB s/p PPM (Medtronic SN: [id number]; in [place], [place]) who was recently seen for lower extremity edema on 10/10/2013 presents for follow up of bilateral lower extremity edema.     Problem List:   Past Medical History  Diagnosis Date  . Peripheral arterial disease     CT angiogram 08/26/2011 showed reduced ABIs bilaterally with ABI of 0.54on the right and 0.72 on the left -- s/p Bilat SFA Atherectomy & PTA-Stents (Dr. Gwenlyn Found);; Dopplers 07/2103 - RABI 0.68; LABI 0.77; patent SFA stents, Bilat ATA& PTA occlusion w/ 1 V runnoff (Peroneal A)  . Hypertension   . Complete heart block     s/p PPM  . Pacemaker   . Hypertension   . Arthritis    Past Surgical History  Procedure Laterality Date  . Knee surgery    . Pacemaker insertion  09/09/2011    Adapta Versa DR Dual chamber  Medtronic  last checked 11/10/2011,   . Insert / replace / remove pacemaker    . Femoral artery stent  8 & 01/2013    Bilateral SFA Rotational Atherectomy & Stent.  . Nm myoview ltd  01/2013    Small Inferoapical infarct; No ischemia; Normal EF  . Transthoracic echocardiogram  09/2011    EF 60%, otherwise normal     Allergies:  No Known Allergies   HPI:  80 y.o. male with the above problem list presents for follow up of his lower extremity edema. Patient has previously been followed by Drs Debara Pickett and Croitoru for his CHB s/p PPM and Dr. Gwenlyn Found for his PAD in [place], [place], but recently established here in [place] 2/2 being closer to home as he lives in [place], [place].   He underwent peripheral  angiography which revealed highly calcified segmental mid SFA stenosis bilaterally with one vessel runoff via peroneal artery. Dr. Gwenlyn Found performed diamondback atherectomy and IDEV (6.5 mm x 100 mm) stenting of the mid RSFA& balloon angioplasty of the popliteal artery in August 2014. Then in October he underwent staged intervention on the left SFA with diamondback orbital atherectomy with PTA and stenting (6.5 mm and one 20 mm Supera-self expanding stent). Last set of ABI's (07/2013; RABI 0.68; LABI 0.77, this is a decline from 03/2013 when they were RABI 0.95 and LABI 0.91.) Stents were noted to be patent, reduced ABI's but no change in velocities. He was placed on Pletal.   He was recently seen on 10/10/2013 to establish care here locally. At that time he noted worsening lower extremity edema, worsening blood pressure, and a dry hacking cough. He was noted to have had a passing out episode, possibly 2/2 coughing. He denied any rapid HR or palpitations associated with it. On PPM interrogation he was found to have some NSVT x 11 episodes lasting 3-9 seconds. He was found to have 2-3+ pitting edema bilaterally, but denied any claudication. He was started on lasix 20 mg daily and advised to elevate her legs.  Today he comes in after calling the office a couple of days ago stating that his legs have continued to swell. He was advised at that time that  we made an appointment for him, go to the ER if needed, and to increase his lasix to 40 mg x 2-3 days. He did not increase his lasix, he has continued to take 20 mg daily. He notes his lower extremity swelling is mildly increased from his June visit. He denies and SOB/DOE, chest pain, or palpitations. He denies any claudication. He has used compression stockings occasionally in the past, but never routinely, and not recently. He also notes he will elevate his legs for a short time period then let them hang over the edge of the bed 2/2 to "it feels better." He also sleeps  with a triangular wedge underneath his legs to keep his knees bent for comfort. This is recent, about the time when his swelling really took off. Weight has been stable.    He has history of CHB s/p PPM ([place].) Mostly atrial sensed and ventricular paced. About 1 year ago he was having some exertional dyspnea which was evaluated by Myoview demonstrating EF of 64%, mild diaphragmatic attenuation, mild apical hypokinesis with no evidence of ischemia but likely inferolateral infarction. He does have remote history of poorly controlled HTN. Currently on losartan, this was recently changed form lisinopril 2/2 cough. Still notes dry hacking cough. No SOB/DOE. No chest pain or palpitations.    Home Medications:  Prior to Admission medications   Medication Sig Start Date End Date Taking? Authorizing Provider  aspirin EC 81 MG tablet Take 81 mg by mouth daily.   Yes Historical Provider, MD  cilostazol (PLETAL) 50 MG tablet Take 1 tablet (50 mg total) by mouth 2 (two) times daily. 07/09/13  Yes Lorretta Harp, MD  ferrous sulfate 325 (65 FE) MG tablet Take 325 mg by mouth daily with breakfast.   Yes Historical Provider, MD  furosemide (LASIX) 20 MG tablet Take 1 tablet (20 mg total) by mouth daily. 10/10/13  Yes Leonie Man, MD  losartan (COZAAR) 50 MG tablet Take 1 tablet (50 mg total) by mouth daily. 10/10/13  Yes Leonie Man, MD     Weights: Filed Weights   11/30/13 1437  Weight: 196 lb 12 oz (89.245 kg)     Review of Systems:  All other systems reviewed and are otherwise negative except as noted above.  Physical Exam:  Blood pressure 140/72, pulse 68, height 5\' 10"  (1.778 m), weight 196 lb 12 oz (89.245 kg).  General: Pleasant, NAD Psych: Normal affect. Neuro: Alert and oriented X 3. Moves all extremities spontaneously. HEENT: Normal  Neck: Supple without bruits or JVD. Lungs:  Resp regular and unlabored, CTA. Heart: RRR no s3, s4, 1/6 systolic murmur. Abdomen: Soft,  non-tender, non-distended, BS + x 4.  Extremities: No clubbing or cyanosis. DP/PT faint and equal bilaterally. 2+ bilateral pitting edema to the mid shin, venous stasis dermatitis noted. Dry scaly skin noted as well.    Accessory Clinical Findings:  EKG - Atrial sensed ventricular paced, 70, RBBB pattern   Assessment & Plan:  1. Bilateral lower extremity edema: This appears to be mostly dependent edema on exam. He currently does not exhibit any signs of CHF. He has not been wearing compression stockings, nor has he been elevating his legs. He also has been sleeping with a triangular wedge underneath his legs further interfering with his venous return. I have discussed this case with Dr. Fletcher Anon who recommends the following, compression stockings 15-20 mm Hg during the daytime, take off at night (rx written), elevate legs above the level  of the heart at 20 minute intervals, continue lasix 20 mg daily.    2. CHB s/p PPM: Remote monitoring. Schedule in office interrogation.   3. Persistent cough: Certainly could be 2/2 his arb. He was also recently on an acei. I have discontinued his arb and placed him on amlodipine. If his cough persists further work will need to be done. Last EF 64% (2014.)  4. PAD: S/p SFA-stent-patent stents by dopplers 07/2013. No claudication. On Pletal. Next due for follow up imaging 08/2014.   5. HTN: Moderately controlled. I have discontinued his losartan 2/2 cough and changed him to amlodipine 5 mg. His daughter, who is a Marine scientist, will monitor his BP. If it remains elevated ok to increase to 10 mg daily in about 2 weeks.    6. H/O NSVT: He did have 11 episodes lasting 3-9 seconds about a month ago. Normal EF, but does have a small infarct on Myoview. Has not had any further syncope. Sounds like form prior note this was related to coughing episode. Should his symptoms return recommend further cardiac work up.   7. Dispo: Follow up with Dr. Ellyn Hack 2-4 weeks.    Christell Faith,  MHS, PA-C [facility] [address] [place], [place] [address] [phone] [facility] 11/30/2013 3:19 PM

## 2013-11-30 NOTE — Patient Instructions (Signed)
Your physician has recommended you make the following change in your medication:  Stop Losartan  Start Amlodipine 5 mg once daily   Your physician recommends that you have labs today: BMP  Your physician recommends that you schedule a follow-up appointment in:  With Dr. Ellyn Hack in 2-4 weeks    Continue Lasix 20 mg once daily   Wear compression stockings and elevate legs as discussed (20 min intervals)

## 2013-12-01 LAB — BASIC METABOLIC PANEL
BUN / CREAT RATIO: 11 (ref 10–22)
BUN: 12 mg/dL (ref 8–27)
CO2: 24 mmol/L (ref 18–29)
Calcium: 8.7 mg/dL (ref 8.6–10.2)
Chloride: 102 mmol/L (ref 97–108)
Creatinine, Ser: 1.05 mg/dL (ref 0.76–1.27)
GFR calc Af Amer: 72 mL/min/{1.73_m2} (ref >59)
GFR calc non Af Amer: 63 mL/min/{1.73_m2} (ref >59)
Glucose: 70 mg/dL (ref 65–99)
Potassium: 4.9 mmol/L (ref 3.5–5.2)
Sodium: 142 mmol/L (ref 134–144)

## 2013-12-04 ENCOUNTER — Encounter: Payer: Self-pay | Admitting: Cardiology

## 2013-12-11 ENCOUNTER — Encounter: Payer: Self-pay | Admitting: Cardiovascular Disease

## 2013-12-14 ENCOUNTER — Encounter: Payer: Self-pay | Admitting: *Deleted

## 2013-12-18 ENCOUNTER — Encounter: Payer: Self-pay | Admitting: Podiatry

## 2013-12-18 ENCOUNTER — Ambulatory Visit (INDEPENDENT_AMBULATORY_CARE_PROVIDER_SITE_OTHER): Payer: Medicare Other | Admitting: Podiatry

## 2013-12-18 VITALS — BP 153/84 | HR 76 | Resp 12

## 2013-12-18 DIAGNOSIS — B351 Tinea unguium: Secondary | ICD-10-CM

## 2013-12-18 DIAGNOSIS — M79673 Pain in unspecified foot: Secondary | ICD-10-CM

## 2013-12-18 DIAGNOSIS — M79609 Pain in unspecified limb: Secondary | ICD-10-CM

## 2013-12-19 ENCOUNTER — Encounter: Payer: Self-pay | Admitting: Cardiovascular Disease

## 2013-12-19 ENCOUNTER — Ambulatory Visit: Payer: Medicare Other | Admitting: Cardiology

## 2013-12-19 NOTE — Progress Notes (Signed)
Subjective:     Patient ID: Danny Buck, male   DOB: [birth year], 80 y.o.   MRN: [id number]  HPI patient presents with thick yellow nailbeds 1-5 both feet that are painful and he cannot cut   Review of Systems     Objective:   Physical Exam Neurovascular status intact with thick painful nailbeds 1-5 of both feet    Assessment:     Mycotic nail infection is with pain 1-5 both feet    Plan:     Debris painful nailbeds 1-5 both feet with no iatrogenic bleeding noted

## 2013-12-21 ENCOUNTER — Ambulatory Visit (INDEPENDENT_AMBULATORY_CARE_PROVIDER_SITE_OTHER): Payer: Medicare Other | Admitting: *Deleted

## 2013-12-21 DIAGNOSIS — I442 Atrioventricular block, complete: Secondary | ICD-10-CM

## 2013-12-22 ENCOUNTER — Encounter: Payer: Self-pay | Admitting: Cardiovascular Disease

## 2013-12-22 LAB — MDC_IDC_ENUM_SESS_TYPE_REMOTE
Battery Impedance: 226 Ohm
Battery Remaining Longevity: 101 mo
Battery Voltage: 2.78 V
Brady Statistic AP VP Percent: 67 %
Brady Statistic AP VS Percent: 1 %
Brady Statistic AS VP Percent: 31 %
Lead Channel Impedance Value: 415 Ohm
Lead Channel Impedance Value: 418 Ohm
Lead Channel Pacing Threshold Amplitude: 0.5 V
Lead Channel Pacing Threshold Amplitude: 0.625 V
Lead Channel Sensing Intrinsic Amplitude: 2.8 mV
Lead Channel Setting Pacing Amplitude: 1.5 V
Lead Channel Setting Pacing Pulse Width: 0.4 ms
MDC IDC MSMT LEADCHNL RA PACING THRESHOLD PULSEWIDTH: 0.4 ms
MDC IDC MSMT LEADCHNL RV PACING THRESHOLD PULSEWIDTH: 0.4 ms
MDC IDC SESS DTM: 20150822184240
MDC IDC SET LEADCHNL RV PACING AMPLITUDE: 2 V
MDC IDC SET LEADCHNL RV SENSING SENSITIVITY: 2.8 mV
MDC IDC STAT BRADY AS VS PERCENT: 1 %

## 2013-12-24 ENCOUNTER — Encounter: Payer: Self-pay | Admitting: Cardiovascular Disease

## 2013-12-24 NOTE — Progress Notes (Signed)
Remote pacemaker transmission.   

## 2013-12-26 ENCOUNTER — Other Ambulatory Visit: Payer: Self-pay | Admitting: Cardiovascular Disease

## 2013-12-26 ENCOUNTER — Encounter: Payer: Self-pay | Admitting: Cardiovascular Disease

## 2013-12-28 ENCOUNTER — Telehealth: Payer: Self-pay | Admitting: *Deleted

## 2013-12-28 NOTE — Telephone Encounter (Signed)
Spoke to [place] regarding the letter she received about pt's ppm. I informed her that the letter was about the pt missing an appt during the month of his recall. Ms. Marrow voiced understanding. Per her request pt will follow up in [place] for his appts. Recall made for pt to F/U in Nov. W/SK.

## 2014-01-01 ENCOUNTER — Encounter: Payer: Self-pay | Admitting: Cardiology

## 2014-01-02 ENCOUNTER — Ambulatory Visit (INDEPENDENT_AMBULATORY_CARE_PROVIDER_SITE_OTHER): Payer: Medicare Other | Admitting: Cardiology

## 2014-01-02 ENCOUNTER — Encounter: Payer: Self-pay | Admitting: Cardiology

## 2014-01-02 VITALS — BP 174/80 | HR 85 | Ht 70.0 in | Wt 191.5 lb

## 2014-01-02 DIAGNOSIS — I471 Supraventricular tachycardia, unspecified: Secondary | ICD-10-CM

## 2014-01-02 DIAGNOSIS — I472 Ventricular tachycardia, unspecified: Secondary | ICD-10-CM

## 2014-01-02 DIAGNOSIS — I4729 Other ventricular tachycardia: Secondary | ICD-10-CM

## 2014-01-02 DIAGNOSIS — R600 Localized edema: Secondary | ICD-10-CM

## 2014-01-02 DIAGNOSIS — I10 Essential (primary) hypertension: Secondary | ICD-10-CM

## 2014-01-02 DIAGNOSIS — R55 Syncope and collapse: Secondary | ICD-10-CM

## 2014-01-02 DIAGNOSIS — R609 Edema, unspecified: Secondary | ICD-10-CM

## 2014-01-02 DIAGNOSIS — I739 Peripheral vascular disease, unspecified: Secondary | ICD-10-CM

## 2014-01-02 MED ORDER — AMLODIPINE BESYLATE 10 MG PO TABS: 10.0000 mg | ORAL_TABLET | Freq: Every day | ORAL | Status: DC

## 2014-01-02 NOTE — Patient Instructions (Signed)
Your physician has recommended you make the following change in your medication:  Increase Amlodipine to 10 mg once daily  You can take an extra  Lasix tablet for swelling. If you have to do this more than twice a week please call us.   Your physician recommends that you schedule a follow-up appointment in:  With device clinic at their next available opening     Your physician wants you to follow-up in: in late December/early January with Dr. Ellyn Hack. You will receive a reminder letter in the mail two months in advance. If you don't receive a letter, please call our office to schedule the follow-up appointment.

## 2014-01-04 ENCOUNTER — Encounter: Payer: Self-pay | Admitting: Cardiovascular Disease

## 2014-01-04 ENCOUNTER — Encounter: Payer: Self-pay | Admitting: Cardiology

## 2014-01-04 NOTE — Assessment & Plan Note (Signed)
Relatively stable with low claudication. Continue Pletal. Due for followup Dopplers in April 2016

## 2014-01-04 NOTE — Assessment & Plan Note (Addendum)
No recurrent episodes second toe. He is due for a pacemaker evaluation soon. They said that the soonest appointment available would be here in September.

## 2014-01-04 NOTE — Assessment & Plan Note (Signed)
His blood pressures look too high now. Will increase amlodipine to 10 mg daily.

## 2014-01-04 NOTE — Assessment & Plan Note (Signed)
No recurrent episodes. 

## 2014-01-04 NOTE — Assessment & Plan Note (Signed)
Again his edema seems mostly dependent in nature not necessarily related to any heart failure symptoms. It has improved with the use of Lasix, but still remains an issue. We talked about again leg elevation and the importance of wearing compression stockings when it gets them. Can also use additional when necessary doses of Lasix for worsening edema. I'm a little urea of using amlodipine in somebody with edema, but his cough does seem to have improved.

## 2014-01-04 NOTE — Progress Notes (Signed)
PCP: Pcp Not In System  Clinic Note: Chief Complaint  Patient presents with  . Other    2-4 wk f/u c/o edema ankles/legs. Meds reviewed verbally with pt.    HPI: Danny Buck is a 80 y.o. male with a Cardiovascular Problem List below who presents today for one-month followup. His recall is very pleasant gentleman with significant PAD and lower extremity venous stasis who is status post pacemaker placement for complete heart block. I saw him in June 1 time and noted significant edema. He was started on low-dose Lasix and was instructed to get support hose and elevate his feet. Unfortunately he did not get the support hose yet. We also switched him from an ACE inhibitor to ARB due to dry persistent cough. He never got the ARB filled, but when he presented back to this clinic and saw Christell Faith, PA-C - this was not accurately discussed because he ARB was changed to amlodipine. Similar suggestions were made about obtaining compression stockings and Lasix at that visit. Unfortunately, he still has persistent lower extremity edema, and has yet to get the compression stocking prescription and filled, stating that the pharmacy had the back order his size.  Interval History: With the exception of his edema, Danny Buck seemed to do relatively well. He does know the "tires out easily", but denies any heart failure symptoms of PND or orthopnea to go along the edema. He doesn't necessarily note exertional dyspnea, just fatigue. He has no sensation of a rapid or irregular heartbeats no significant near-syncope. No TIA or versus BX of this. Doesn't really describes claudication symptoms. He denies a resting exertional angina.  Past Medical History  Diagnosis Date  . Peripheral arterial disease     CT angiogram 08/26/2011 showed reduced ABIs bilaterally with ABI of 0.54on the right and 0.72 on the left -- s/p Bilat SFA Atherectomy & PTA-Stents (Dr. Gwenlyn Found);; Dopplers 07/2103 - RABI 0.68; LABI 0.77; patent SFA  stents, Bilat ATA& PTA occlusion w/ 1 V runnoff (Peroneal A)  . Hypertension   . Complete heart block     s/p PPM  . Pacemaker   . Hypertension   . Arthritis    Prior Cardiac Evaluation and History: Procedure Laterality Date  . Pacemaker insertion  09/09/2011    Adapta Versa DR Dual chamber  [employer]tronic  last checked 11/10/2011,   . Insert / replace / remove pacemaker    . Femoral artery stent  8 & 01/2013    Bilateral SFA Rotational Atherectomy & Stent.  . Nm myoview ltd  01/2013    Small Inferoapical infarct; No ischemia; Normal EF  . Transthoracic echocardiogram  09/2011    EF 60%, otherwise normal   MEDICATIONS AND ALLERGIES REVIEWED IN EPIC No Change in Social and Family History  ROS: A comprehensive Review of Systems - Was performed Review of Systems  Constitutional: Positive for weight loss and malaise/fatigue.       Weight loss may be related to diuresis. He also says he is not eating and drinking as much as he use to  Eyes: Negative for blurred vision.  Respiratory: Positive for cough. Negative for hemoptysis, sputum production, shortness of breath and wheezing.        Less prominent cough  Cardiovascular: Positive for leg swelling.  Gastrointestinal: Positive for constipation. Negative for heartburn, nausea, vomiting, blood in stool and melena.  Genitourinary: Negative for frequency, hematuria and flank pain.  Neurological: Negative for headaches.  Psychiatric/Behavioral: Negative for depression. The patient is not  nervous/anxious.   All other systems reviewed and are negative.  Wt Readings from Last 3 Encounters:  01/02/14 191 lb 8 oz (86.864 kg)  11/30/13 196 lb 12 oz (89.245 kg)  10/10/13 198 lb 8 oz (90.039 kg)   PHYSICAL EXAM BP 174/80  Pulse 85  Ht 5\' 10"  (1.778 m)  Wt 191 lb 8 oz (86.864 kg)  BMI 27.48 kg/m2 General appearance: alert, cooperative, appears stated age, no distress and Pleasant mood and affect.  Neck: no adenopathy, no carotid bruit, no JVD,  supple, symmetrical, trachea midline and . He remains but not distended  Lungs: clear to auscultation bilaterally, normal percussion bilaterally and Nonlabored, good air movement  Heart: normal apical impulse and RRR, normal S1 and S2 with a soft SM (1/6) along the sternal border. No other R./G.  Abdomen: soft, non-tender; bowel sounds normal; no masses, no organomegaly  Extremities: edema 2+ pitting to the mid calf, venous stasis dermatitis noted and Dry scaly skin consistent with PAD Pulses: Faint, barely palpable bilateral pedal pulses  Neurologic: Grossly normal   Adult ECG Report  Rate: 85 ;  Rhythm: A sense V. paced rhythm  ASSESSMENT / PLAN: Bilateral leg edema Again his edema seems mostly dependent in nature not necessarily related to any heart failure symptoms. It has improved with the use of Lasix, but still remains an issue. We talked about again leg elevation and the importance of wearing compression stockings when it gets them. Can also use additional when necessary doses of Lasix for worsening edema. I'm a little urea of using amlodipine in somebody with edema, but his cough does seem to have improved.  Essential hypertension His blood pressures look too high now. Will increase amlodipine to 10 mg daily.  Nonsustained ventricular tachycardia No recurrent episodes second toe. He is due for a pacemaker evaluation soon. They said that the soonest appointment available would be here in September.  PAD (peripheral artery disease) Relatively stable with low claudication. Continue Pletal. Due for followup Dopplers in April 2016  Paroxysmal atrial tachycardia I can't tell how much this is happening. We'll hopefully get some data from his pacemaker evaluation.  Syncope No recurrent episodes.    Orders Placed This Encounter  Procedures  . EKG 12-Lead    Order Specific Question:  Where should this test be performed    Answer:  [facility]   Meds ordered this encounter    Medications  . amLODipine (NORVASC) 10 MG tablet    Sig: Take 1 tablet (10 mg total) by mouth daily.    Dispense:  180 tablet    Refill:  3    Followup: 4 months  DAVID W. Ellyn Hack, M.D., M.S. Interventional Cardiologist [facility]

## 2014-01-04 NOTE — Assessment & Plan Note (Signed)
I can't tell how much this is happening. We'll hopefully get some data from his pacemaker evaluation.

## 2014-01-24 ENCOUNTER — Ambulatory Visit (INDEPENDENT_AMBULATORY_CARE_PROVIDER_SITE_OTHER): Payer: Medicare Other | Admitting: *Deleted

## 2014-01-24 DIAGNOSIS — I442 Atrioventricular block, complete: Secondary | ICD-10-CM

## 2014-01-24 LAB — MDC_IDC_ENUM_SESS_TYPE_INCLINIC
Battery Remaining Longevity: 97 mo
Battery Voltage: 2.78 V
Brady Statistic AP VS Percent: 1 %
Brady Statistic AS VP Percent: 30 %
Lead Channel Impedance Value: 384 Ohm
Lead Channel Impedance Value: 407 Ohm
Lead Channel Pacing Threshold Amplitude: 0.5 V
Lead Channel Pacing Threshold Amplitude: 0.5 V
Lead Channel Pacing Threshold Pulse Width: 0.4 ms
Lead Channel Sensing Intrinsic Amplitude: 8 mV
Lead Channel Setting Pacing Amplitude: 1.5 V
Lead Channel Setting Pacing Amplitude: 2 V
Lead Channel Setting Pacing Pulse Width: 0.4 ms
Lead Channel Setting Sensing Sensitivity: 2.8 mV
MDC IDC MSMT BATTERY IMPEDANCE: 250 Ohm
MDC IDC MSMT LEADCHNL RA PACING THRESHOLD PULSEWIDTH: 0.4 ms
MDC IDC MSMT LEADCHNL RA SENSING INTR AMPL: 2 mV
MDC IDC SESS DTM: 20150924151203
MDC IDC STAT BRADY AP VP PERCENT: 68 %
MDC IDC STAT BRADY AS VS PERCENT: 1 %

## 2014-01-24 NOTE — Progress Notes (Signed)
PPM check in office. 

## 2014-02-04 ENCOUNTER — Encounter: Payer: Self-pay | Admitting: Internal Medicine

## 2014-03-19 ENCOUNTER — Ambulatory Visit: Payer: Medicare Other

## 2014-03-26 ENCOUNTER — Encounter: Payer: Medicare Other | Admitting: Internal Medicine

## 2014-04-11 ENCOUNTER — Encounter (HOSPITAL_COMMUNITY): Payer: Self-pay | Admitting: Cardiovascular Disease

## 2014-05-07 ENCOUNTER — Encounter: Payer: Self-pay | Admitting: Internal Medicine

## 2014-05-07 ENCOUNTER — Ambulatory Visit (INDEPENDENT_AMBULATORY_CARE_PROVIDER_SITE_OTHER): Payer: Medicare Other | Admitting: Internal Medicine

## 2014-05-07 VITALS — BP 138/76 | HR 86 | Ht 70.0 in | Wt 190.5 lb

## 2014-05-07 DIAGNOSIS — I471 Supraventricular tachycardia: Secondary | ICD-10-CM

## 2014-05-07 DIAGNOSIS — I442 Atrioventricular block, complete: Secondary | ICD-10-CM

## 2014-05-07 LAB — MDC_IDC_ENUM_SESS_TYPE_INCLINIC
Battery Remaining Longevity: 89 mo
Battery Voltage: 2.79 V
Lead Channel Impedance Value: 408 Ohm
Lead Channel Pacing Threshold Amplitude: 0.5 V
Lead Channel Pacing Threshold Amplitude: 0.75 V
Lead Channel Pacing Threshold Pulse Width: 0.4 ms
Lead Channel Sensing Intrinsic Amplitude: 2 mV
Lead Channel Sensing Intrinsic Amplitude: 8 mV
Lead Channel Setting Pacing Amplitude: 1.5 V
Lead Channel Setting Pacing Pulse Width: 0.4 ms
MDC IDC MSMT BATTERY IMPEDANCE: 324 Ohm
MDC IDC MSMT LEADCHNL RA PACING THRESHOLD PULSEWIDTH: 0.4 ms
MDC IDC MSMT LEADCHNL RV IMPEDANCE VALUE: 389 Ohm
MDC IDC SESS DTM: 20160105111002
MDC IDC SET LEADCHNL RV PACING AMPLITUDE: 2 V
MDC IDC SET LEADCHNL RV SENSING SENSITIVITY: 2.8 mV
MDC IDC STAT BRADY AP VP PERCENT: 69 %
MDC IDC STAT BRADY AP VS PERCENT: 0 %
MDC IDC STAT BRADY AS VP PERCENT: 29 %
MDC IDC STAT BRADY AS VS PERCENT: 1 %

## 2014-05-07 NOTE — Progress Notes (Signed)
Patient Care Team: Pcp Not In System as PCP - General   HPI  Danny Buck is a 81 y.o. male Seen in follow-up for pacemaker implanted May 2013 for complete heart block. ([place])  He has known peripheral vascular disease with prior revascularization. He has been followed by Dr. Hiram Comber.  Echocardiogram 5/13-[place] demonstrated ejection fraction of 60% Myoview scanning 6/14 demonstrated no ischemia and a "small apical scar"  He has been noted on prior device interrogation to have nonsustained ventricular tachycardia  He has some problems with dyspnea on exertion.  Past Medical History  Diagnosis Date  . Peripheral arterial disease     CT angiogram 08/26/2011 showed reduced ABIs bilaterally with ABI of 0.54on the right and 0.72 on the left -- s/p Bilat SFA Atherectomy & PTA-Stents (Dr. Gwenlyn Found);; Dopplers 07/2103 - RABI 0.68; LABI 0.77; patent SFA stents, Bilat ATA& PTA occlusion w/ 1 V runnoff (Peroneal A)  . Hypertension   . Complete heart block     s/p PPM  . Pacemaker   . Hypertension   . Arthritis     Past Surgical History  Procedure Laterality Date  . Knee surgery    . Pacemaker insertion  09/09/2011    Adapta Versa DR Dual chamber  [employer]tronic  last checked 11/10/2011,   . Insert / replace / remove pacemaker    . Femoral artery stent  8 & 01/2013    Bilateral SFA Rotational Atherectomy & Stent.  . Nm myoview ltd  01/2013    Small Inferoapical infarct; No ischemia; Normal EF  . Transthoracic echocardiogram  09/2011    EF 60%, otherwise normal  . Lower extremity angiogram Bilateral 12/12/2012    Procedure: LOWER EXTREMITY ANGIOGRAM;  Surgeon: Lorretta Harp, MD;  Location: [facility] CATH LAB;  Service: Cardiovascular;  Laterality: Bilateral;  . Percutaneous stent intervention Right 12/12/2012    Procedure: PERCUTANEOUS STENT INTERVENTION;  Surgeon: Lorretta Harp, MD;  Location: [facility] CATH LAB;  Service: Cardiovascular;  Laterality: Right;  atherectomy and stent  to rt sfa  . Abdominal aortagram  12/12/2012    Procedure: ABDOMINAL AORTAGRAM;  Surgeon: Lorretta Harp, MD;  Location: [facility] CATH LAB;  Service: Cardiovascular;;  . Lower extremity angiogram N/A 02/13/2013    Procedure: LOWER EXTREMITY ANGIOGRAM;  Surgeon: Lorretta Harp, MD;  Location: [facility] CATH LAB;  Service: Cardiovascular;  Laterality: N/A;  . Percutaneous stent intervention Left 02/13/2013    Procedure: PERCUTANEOUS STENT INTERVENTION;  Surgeon: Lorretta Harp, MD;  [facility] CATH LAB;  Service: Cardiovascular;  Laterality: Left;  lt sfa stent    Current Outpatient Prescriptions  Medication Sig Dispense Refill  . amLODipine (NORVASC) 10 MG tablet Take 5 mg by mouth daily.    Marland Kitchen aspirin EC 81 MG tablet Take 81 mg by mouth daily.    . ferrous sulfate 325 (65 FE) MG tablet Take 325 mg by mouth daily with breakfast.    . furosemide (LASIX) 20 MG tablet Take 1 tablet (20 mg total) by mouth daily. 90 tablet 3   No current facility-administered medications for this visit.    No Known Allergies  Review of Systems negative except from HPI and PMH  Physical Exam BP 138/76 mmHg  Pulse 86  Ht 5\' 10"  (1.778 m)  Wt 190 lb 8 oz (86.41 kg)  BMI 27.33 kg/m2 Well developed and well nourished in no acute distress HENT normal E scleral and icterus clear Neck Supple JVP flat; carotids brisk and  full Clear to ausculation *Regular rate and rhythm, no murmurs gallops or rub Soft with active bowel sounds No clubbing cyanosis 1+ Edema Alert and oriented, grossly normal motor and sensory function Skin Warm and Dry  Sinus rhythm at 86 with P synchronous pacing  Assessment and  Plan  Complete heart block  Pacemaker-Medtronic The patient's device was interrogated and the information was fully reviewed.  The device was reprogrammed to  Inactivate rate response  Ventricular tachycardia-nonsustained  Hypertension  Dyspnea on exertion  He appears to have adequate chronotropic response  so we will inactivate rate response. I wonder whether some of his dyspnea on exertion is not related to overzealous pacemaker program.  Blood pressure is reasonably controlled  He has modest volume overload but I will defer this to Dr. Ellyn Hack We spent more than 50% of our >25 min visit in face to face counseling regarding the above

## 2014-05-07 NOTE — Patient Instructions (Signed)
Remote monitoring is used to monitor your Pacemaker of ICD from home. This monitoring reduces the number of office visits required to check your device to one time per year. It allows [place] to keep an eye on the functioning of your device to ensure it is working properly. You are scheduled for a device check from home on 08/06/14. You may send your transmission at any time that day. If you have a wireless device, the transmission will be sent automatically. After your physician reviews your transmission, you will receive a postcard with your next transmission date.  Your physician wants you to follow-up in: 1 year with Dr. Caryl Comes. You will receive a reminder letter in the mail two months in advance. If you don't receive a letter, please call our office to schedule the follow-up appointment.  Your physician recommends that you continue on your current medications as directed. Please refer to the Current Medication list given to you today.

## 2014-06-05 ENCOUNTER — Ambulatory Visit (INDEPENDENT_AMBULATORY_CARE_PROVIDER_SITE_OTHER): Payer: Medicare Other | Admitting: Cardiology

## 2014-06-05 ENCOUNTER — Encounter: Payer: Self-pay | Admitting: Cardiology

## 2014-06-05 VITALS — BP 150/70 | HR 62 | Ht 69.0 in | Wt 190.2 lb

## 2014-06-05 DIAGNOSIS — I10 Essential (primary) hypertension: Secondary | ICD-10-CM

## 2014-06-05 DIAGNOSIS — R55 Syncope and collapse: Secondary | ICD-10-CM

## 2014-06-05 DIAGNOSIS — I472 Ventricular tachycardia: Secondary | ICD-10-CM

## 2014-06-05 DIAGNOSIS — I471 Supraventricular tachycardia: Secondary | ICD-10-CM

## 2014-06-05 DIAGNOSIS — I442 Atrioventricular block, complete: Secondary | ICD-10-CM

## 2014-06-05 DIAGNOSIS — I4729 Other ventricular tachycardia: Secondary | ICD-10-CM

## 2014-06-05 DIAGNOSIS — R600 Localized edema: Secondary | ICD-10-CM

## 2014-06-05 MED ORDER — FUROSEMIDE 20 MG PO TABS: 20.0000 mg | ORAL_TABLET | Freq: Every day | ORAL | Status: DC

## 2014-06-05 NOTE — Patient Instructions (Signed)
Your physician has recommended you make the following change in your medication:  You can take one extra tablet of Lasix  (20 mg tablet) for swelling or fluid retention  Please call if you find yourself doing this frequently     Your physician wants you to follow-up in: 6 months. You will receive a reminder letter in the mail two months in advance. If you don't receive a letter, please call our office to schedule the follow-up appointment.   Wear your compression stockings a discussed with Dr. Melody Haver can cut out the toes. Take a break during the day. Wear in the evening and overnight.

## 2014-06-06 ENCOUNTER — Encounter: Payer: Self-pay | Admitting: Cardiology

## 2014-06-06 NOTE — Assessment & Plan Note (Signed)
No recurrent episodes. 

## 2014-06-06 NOTE — Assessment & Plan Note (Signed)
Relatively stable blood pressures. I'm more inclined to tolerate mild permissive hypertension to avoid orthostatic symptoms.

## 2014-06-06 NOTE — Assessment & Plan Note (Signed)
As far as he can tell, no recurrent episodes.

## 2014-06-06 NOTE — Assessment & Plan Note (Signed)
Most likely related to venous stasis and even potential mild protein malnutrition. Does not seem to be associated with other heart failure symptoms. Continue low-dose Lasix, and suggested using additional when necessary doses for worsening edema. Also again strongly recommended using compression stockings. He was complaining of toe discomfort from having his nails clipped too short. It is fine to cut the toes out of the support stockings.

## 2014-06-06 NOTE — Assessment & Plan Note (Signed)
Not noted on pacemaker interrogation.

## 2014-06-06 NOTE — Progress Notes (Signed)
PCP: Pcp Not In System  Clinic Note: Chief Complaint  Patient presents with  . other    84month f/u c/o fatigue. Meds reviewed verbally with pt.    HPI: Danny Buck is a 81 y.o. male with a Cardiovascular Problem List below who presents today for four-month follow-up of complete heart block status post pacer, hypertension and venous stasis edema.     His is a very elderly pleasant gentleman with significant PAD and lower extremity venous stasis who is status post pacemaker placement for complete heart block. I saw him in June 2015 then again in September 2015 having noted improvement of his edema while on Lasix.  He did not start using support hose.  Interval History: With the exception of his edema - which has improved some, Danny Buck seemed to be doing relatively well. He still complains that he "tires out easily", but denies any heart failure symptoms of PND or orthopnea to go along the edema. He doesn't necessarily note exertional dyspnea, just fatigue. He has no sensation of a rapid or irregular heartbeats no significant near-syncope. No TIA or amaurosis fugax. Doesn't really describes claudication symptoms. He denies a resting or exertional angina.   Past Medical History  Diagnosis Date  . Peripheral arterial disease     CT angiogram 08/26/2011 showed reduced ABIs bilaterally with ABI of 0.54on the right and 0.72 on the left -- s/p Bilat SFA Atherectomy & PTA-Stents (Dr. Gwenlyn Found);; Dopplers 07/2103 - RABI 0.68; LABI 0.77; patent SFA stents, Bilat ATA& PTA occlusion w/ 1 V runnoff (Peroneal A)  . Hypertension   . Complete heart block     s/p PPM  . Pacemaker   . Hypertension   . Arthritis    Prior Cardiac Evaluation and History: Procedure Laterality Date  . Pacemaker insertion  09/09/2011    Adapta Versa DR Dual chamber  [employer]tronic  last checked 11/10/2011,   . Insert / replace / remove pacemaker    . Femoral artery stent  8 & 01/2013    Bilateral SFA Rotational Atherectomy &  Stent.  . Nm myoview ltd  01/2013    Small Inferoapical infarct; No ischemia; Normal EF  . Transthoracic echocardiogram  09/2011    EF 60%, otherwise normal   Current Outpatient Prescriptions on File Prior to Visit  Medication Sig Dispense Refill  . amLODipine (NORVASC) 10 MG tablet Take 5 mg by mouth daily.    Marland Kitchen aspirin EC 81 MG tablet Take 81 mg by mouth daily.    . ferrous sulfate 325 (65 FE) MG tablet Take 325 mg by mouth daily with breakfast.     No current facility-administered medications on file prior to visit.   No Known Allergies  No Change in Social and Family History  ROS: A comprehensive Review of Systems - Was performed Review of Systems  Constitutional: Positive for malaise/fatigue.  HENT: Negative for nosebleeds.   Respiratory: Negative for cough and shortness of breath.   Cardiovascular: Negative for claudication.  Gastrointestinal: Negative for blood in stool and melena.  Genitourinary: Negative for hematuria.  Musculoskeletal: Positive for joint pain.  Neurological: Positive for dizziness (Mild positional).  Endo/Heme/Allergies: Does not bruise/bleed easily.  Psychiatric/Behavioral: Negative.   All other systems reviewed and are negative.  Wt Readings from Last 3 Encounters:  06/05/14 190 lb 4 oz (86.297 kg)  05/07/14 190 lb 8 oz (86.41 kg)  01/02/14 191 lb 8 oz (86.864 kg)   PHYSICAL EXAM BP 150/70 mmHg  Pulse 62  Ht  5\' 9"  (1.753 m)  Wt 190 lb 4 oz (86.297 kg)  BMI 28.08 kg/m2 General appearance: alert, cooperative, appears stated age, no distress and Pleasant mood and affect.  Neck: no adenopathy, no carotid bruit, no JVD, supple, symmetrical, trachea midline and . He remains but not distended  Lungs: clear to auscultation bilaterally, normal percussion bilaterally and Nonlabored, good air movement  Heart: normal apical impulse and RRR, normal S1 and S2 with a soft SM (1/6) along the sternal border. No other R./G.  Abdomen: soft, non-tender; bowel  sounds normal; no masses, no organomegaly  Extremities: edema 2+ pitting to the mid calf, venous stasis dermatitis noted and Dry scaly skin consistent with PAD Pulses: Faint, barely palpable bilateral pedal pulses  Neurologic: Grossly normal   Adult ECG Report  Rate: 85 ;  Rhythm: A sense V. paced rhythm  ASSESSMENT / PLAN: Bilateral leg edema Most likely related to venous stasis and even potential mild protein malnutrition. Does not seem to be associated with other heart failure symptoms. Continue low-dose Lasix, and suggested using additional when necessary doses for worsening edema. Also again strongly recommended using compression stockings. He was complaining of toe discomfort from having his nails clipped too short. It is fine to cut the toes out of the support stockings.   Essential hypertension Relatively stable blood pressures. I'm more inclined to tolerate mild permissive hypertension to avoid orthostatic symptoms.   Nonsustained ventricular tachycardia As far as he can tell, no recurrent episodes.   Complete heart block Pacemaker adjusted back in January to allow for better chronotropic response. He doesn't note that much improvement in his baseline exertional dyspnea, but thinks it may be a bit better   Paroxysmal atrial tachycardia Not noted on pacemaker interrogation.   Syncope No recurrent episodes    No orders of the defined types were placed in this encounter.   Meds ordered this encounter  Medications  . furosemide (LASIX) 20 MG tablet    Sig: Take 1 tablet (20 mg total) by mouth daily.    Dispense:  90 tablet    Refill:  3    Followup: 6 months  DAVID W. Ellyn Hack, M.D., M.S. Interventional Cardiologist [facility]

## 2014-06-06 NOTE — Assessment & Plan Note (Signed)
Pacemaker adjusted back in January to allow for better chronotropic response. He doesn't note that much improvement in his baseline exertional dyspnea, but thinks it may be a bit better

## 2014-06-07 ENCOUNTER — Encounter: Payer: Self-pay | Admitting: Internal Medicine

## 2014-06-28 ENCOUNTER — Encounter: Payer: Self-pay | Admitting: Cardiovascular Disease

## 2014-08-06 ENCOUNTER — Ambulatory Visit (INDEPENDENT_AMBULATORY_CARE_PROVIDER_SITE_OTHER): Payer: Medicare Other | Admitting: *Deleted

## 2014-08-06 ENCOUNTER — Telehealth: Payer: Self-pay | Admitting: Cardiology

## 2014-08-06 DIAGNOSIS — I442 Atrioventricular block, complete: Secondary | ICD-10-CM

## 2014-08-06 LAB — MDC_IDC_ENUM_SESS_TYPE_REMOTE
Battery Remaining Longevity: 91 mo
Brady Statistic AP VP Percent: 67 %
Brady Statistic AP VS Percent: 0 %
Lead Channel Impedance Value: 412 Ohm
Lead Channel Pacing Threshold Amplitude: 0.625 V
Lead Channel Pacing Threshold Pulse Width: 0.4 ms
Lead Channel Pacing Threshold Pulse Width: 0.4 ms
Lead Channel Sensing Intrinsic Amplitude: 1.4 mV
Lead Channel Setting Pacing Amplitude: 1.5 V
Lead Channel Setting Pacing Pulse Width: 0.4 ms
Lead Channel Setting Sensing Sensitivity: 4 mV
MDC IDC MSMT BATTERY IMPEDANCE: 375 Ohm
MDC IDC MSMT BATTERY VOLTAGE: 2.78 V
MDC IDC MSMT LEADCHNL RA PACING THRESHOLD AMPLITUDE: 0.5 V
MDC IDC MSMT LEADCHNL RV IMPEDANCE VALUE: 387 Ohm
MDC IDC SESS DTM: 20160405182742
MDC IDC SET LEADCHNL RV PACING AMPLITUDE: 2 V
MDC IDC STAT BRADY AS VP PERCENT: 28 %
MDC IDC STAT BRADY AS VS PERCENT: 4 %

## 2014-08-06 NOTE — Progress Notes (Signed)
Remote pacemaker transmission.   

## 2014-08-06 NOTE — Telephone Encounter (Signed)
Spoke with pt and reminded pt of remote transmission that is due today. Pt verbalized understanding.   

## 2014-09-10 ENCOUNTER — Encounter: Payer: Self-pay | Admitting: Cardiology

## 2014-09-13 ENCOUNTER — Encounter: Payer: Self-pay | Admitting: Internal Medicine

## 2014-11-05 ENCOUNTER — Ambulatory Visit (INDEPENDENT_AMBULATORY_CARE_PROVIDER_SITE_OTHER): Payer: Medicare Other | Admitting: *Deleted

## 2014-11-05 ENCOUNTER — Encounter: Payer: Self-pay | Admitting: Internal Medicine

## 2014-11-05 DIAGNOSIS — I442 Atrioventricular block, complete: Secondary | ICD-10-CM

## 2014-11-06 NOTE — Progress Notes (Signed)
Remote pacemaker transmission.   

## 2014-11-07 LAB — CUP PACEART REMOTE DEVICE CHECK
Battery Impedance: 350 Ohm
Battery Remaining Longevity: 92 mo
Battery Voltage: 2.78 V
Brady Statistic AP VP Percent: 63 %
Brady Statistic AP VS Percent: 0 %
Brady Statistic AS VP Percent: 32 %
Date Time Interrogation Session: 20160705155611
Lead Channel Impedance Value: 407 Ohm
Lead Channel Pacing Threshold Amplitude: 0.5 V
Lead Channel Pacing Threshold Amplitude: 0.625 V
Lead Channel Pacing Threshold Pulse Width: 0.4 ms
Lead Channel Setting Pacing Amplitude: 2 V
Lead Channel Setting Sensing Sensitivity: 2.8 mV
MDC IDC MSMT LEADCHNL RA PACING THRESHOLD PULSEWIDTH: 0.4 ms
MDC IDC MSMT LEADCHNL RA SENSING INTR AMPL: 1.4 mV
MDC IDC MSMT LEADCHNL RV IMPEDANCE VALUE: 371 Ohm
MDC IDC SET LEADCHNL RA PACING AMPLITUDE: 1.5 V
MDC IDC SET LEADCHNL RV PACING PULSEWIDTH: 0.4 ms
MDC IDC STAT BRADY AS VS PERCENT: 5 %

## 2014-12-06 ENCOUNTER — Encounter: Payer: Self-pay | Admitting: Cardiology

## 2014-12-10 ENCOUNTER — Other Ambulatory Visit: Payer: Self-pay

## 2014-12-10 MED ORDER — AMLODIPINE BESYLATE 10 MG PO TABS: 5.0000 mg | ORAL_TABLET | Freq: Every day | ORAL | Status: DC

## 2014-12-10 NOTE — Telephone Encounter (Signed)
Refill sent for amlodipine.  

## 2015-02-04 ENCOUNTER — Ambulatory Visit (INDEPENDENT_AMBULATORY_CARE_PROVIDER_SITE_OTHER): Payer: Medicare Other | Admitting: *Deleted

## 2015-02-04 ENCOUNTER — Telehealth: Payer: Self-pay | Admitting: Cardiology

## 2015-02-04 ENCOUNTER — Encounter: Payer: Self-pay | Admitting: Internal Medicine

## 2015-02-04 DIAGNOSIS — I442 Atrioventricular block, complete: Secondary | ICD-10-CM | POA: Diagnosis not present

## 2015-02-04 NOTE — Progress Notes (Signed)
Remote pacemaker transmission.   

## 2015-02-04 NOTE — Telephone Encounter (Signed)
Spoke with pt and reminded pt of remote transmission that is due today. Pt verbalized understanding.   

## 2015-02-07 LAB — CUP PACEART REMOTE DEVICE CHECK
Battery Impedance: 425 Ohm
Battery Remaining Longevity: 86 mo
Brady Statistic AP VP Percent: 64 %
Brady Statistic AP VS Percent: 0 %
Brady Statistic AS VS Percent: 5 %
Date Time Interrogation Session: 20161004173028
Lead Channel Impedance Value: 370 Ohm
Lead Channel Pacing Threshold Amplitude: 0.625 V
Lead Channel Pacing Threshold Pulse Width: 0.4 ms
Lead Channel Sensing Intrinsic Amplitude: 1.4 mV
Lead Channel Setting Pacing Pulse Width: 0.4 ms
Lead Channel Setting Sensing Sensitivity: 2.8 mV
MDC IDC MSMT BATTERY VOLTAGE: 2.78 V
MDC IDC MSMT LEADCHNL RA IMPEDANCE VALUE: 402 Ohm
MDC IDC MSMT LEADCHNL RA PACING THRESHOLD AMPLITUDE: 0.625 V
MDC IDC MSMT LEADCHNL RV PACING THRESHOLD PULSEWIDTH: 0.4 ms
MDC IDC SET LEADCHNL RA PACING AMPLITUDE: 1.5 V
MDC IDC SET LEADCHNL RV PACING AMPLITUDE: 2 V
MDC IDC STAT BRADY AS VP PERCENT: 31 %

## 2015-03-05 ENCOUNTER — Encounter: Payer: Self-pay | Admitting: Cardiology

## 2015-03-12 ENCOUNTER — Encounter: Payer: Self-pay | Admitting: Cardiology

## 2015-05-07 ENCOUNTER — Ambulatory Visit (INDEPENDENT_AMBULATORY_CARE_PROVIDER_SITE_OTHER): Payer: Medicare Other | Admitting: Cardiology

## 2015-05-07 ENCOUNTER — Encounter: Payer: Self-pay | Admitting: Cardiology

## 2015-05-07 VITALS — BP 154/72 | HR 62 | Ht 69.0 in | Wt 187.0 lb

## 2015-05-07 DIAGNOSIS — I10 Essential (primary) hypertension: Secondary | ICD-10-CM | POA: Diagnosis not present

## 2015-05-07 DIAGNOSIS — I442 Atrioventricular block, complete: Secondary | ICD-10-CM

## 2015-05-07 DIAGNOSIS — I471 Supraventricular tachycardia: Secondary | ICD-10-CM

## 2015-05-07 DIAGNOSIS — R600 Localized edema: Secondary | ICD-10-CM | POA: Diagnosis not present

## 2015-05-07 NOTE — Progress Notes (Signed)
PCP: Pcp Not In System  Clinic Note: Chief Complaint  Patient presents with  . other    6 month follow up. Meds reviewed by the patient verbally. Pt. c/o LE edema.   . Leg Swelling    HPI: Danny Buck is a 82 y.o. male with a PMH below who presents today for 1 yr f/u of HTN, & 3rd Deg AVB-s/p PPM. He is a very elderly pleasant gentleman with significant PAD and lower extremity venous stasis who is status post pacemaker placement for complete heart block. I saw him in June 2015 then again in September 2015 having noted improvement of his edema while on Lasix.  He did not start using support hose.  Danny Buck was last seen on Jun 05 2014.  Recent Hospitalizations: none  Studies Reviewed: Last PPM Dx = Nov 2016 - normal  Interval History: Danny Buck presents today overall doing relatively well. He states that he is still doing everything he wants - just not as swiftly. He notes that he just Gets tired getting dressed.  Just not a lot of Energy.  His edema is relatively stable, but he does note that after a few hours of lying down he starts having a toothache like pain in his legs. He has not actually ever worn support stockings, despite this being recommended. He denies any PND or orthopnea to go along with edema.    No chest pain or shortness of breath with rest or exertion.   No palpitations, lightheadedness, dizziness, weakness or syncope/near syncope. No TIA/amaurosis fugax symptoms. No melena, hematochezia, hematuria, or epstaxis. No claudication.  ROS: A comprehensive was performed. Review of Systems  Constitutional: Positive for malaise/fatigue (poor energy & exercise tolerance).  HENT: Negative for nosebleeds.   Eyes: Negative for double vision.  Respiratory: Negative for cough.   Cardiovascular: Positive for leg swelling. Negative for orthopnea, claudication and PND.  Gastrointestinal: Negative for blood in stool and melena.  Genitourinary: Negative for  hematuria.  Neurological: Negative for dizziness, sensory change, speech change, focal weakness, loss of consciousness and weakness.  Endo/Heme/Allergies: Does not bruise/bleed easily.  All other systems reviewed and are negative.   Past Medical History  Diagnosis Date  . Peripheral arterial disease (HCC)     CT angiogram 08/26/2011 showed reduced ABIs bilaterally with ABI of 0.54on the right and 0.72 on the left -- s/p Bilat SFA Atherectomy & PTA-Stents (Dr. Gwenlyn Found);; Dopplers 07/2103 - RABI 0.68; LABI 0.77; patent SFA stents, Bilat ATA& PTA occlusion w/ 1 V runnoff (Peroneal A)  . Hypertension   . Complete heart block (HCC)     s/p PPM  . Pacemaker   . Hypertension   . Arthritis     Past Surgical History  Procedure Laterality Date  . Knee surgery    . Pacemaker insertion  09/09/2011    Adapta Versa DR Dual chamber  [employer]  last checked 11/10/2011,   . Insert / replace / remove pacemaker    . Femoral artery stent  8 & 01/2013    Bilateral SFA Rotational Atherectomy & Stent.  . Nm myoview ltd  01/2013    Small Inferoapical infarct; No ischemia; Normal EF  . Transthoracic echocardiogram  09/2011    EF 60%, otherwise normal  . Lower extremity angiogram Bilateral 12/12/2012    Procedure: LOWER EXTREMITY ANGIOGRAM;  Surgeon: Lorretta Harp, MD;  Location: [facility] CATH LAB;  Service: Cardiovascular;  Laterality: Bilateral;  . Percutaneous stent intervention Right 12/12/2012    Procedure: PERCUTANEOUS  STENT INTERVENTION;  Surgeon: Lorretta Harp, MD;  Location: [facility] CATH LAB;  Service: Cardiovascular;  Laterality: Right;  atherectomy and stent to rt sfa  . Abdominal aortagram  12/12/2012    Procedure: ABDOMINAL AORTAGRAM;  Surgeon: Lorretta Harp, MD;  Location: [facility] CATH LAB;  Service: Cardiovascular;;  . Lower extremity angiogram N/A 02/13/2013    Procedure: LOWER EXTREMITY ANGIOGRAM;  Surgeon: Lorretta Harp, MD;  Location: [facility] CATH LAB;  Service: Cardiovascular;  Laterality: N/A;  .  Percutaneous stent intervention Left 02/13/2013    Procedure: PERCUTANEOUS STENT INTERVENTION;  Surgeon: Lorretta Harp, MD;  Location: [facility] CATH LAB;  Service: Cardiovascular;  Laterality: Left;  lt sfa stent   Prior to Admission medications   Medication Sig Start Date End Date Taking? Authorizing Provider  amLODipine (NORVASC) 10 MG tablet Take 0.5 tablets (5 mg total) by mouth daily. 12/10/14  Yes Leonie Man, MD  aspirin EC 81 MG tablet Take 81 mg by mouth daily.   Yes Historical Provider, MD  ferrous sulfate 325 (65 FE) MG tablet Take 325 mg by mouth daily with breakfast.   Yes Historical Provider, MD  furosemide (LASIX) 20 MG tablet Take 1 tablet (20 mg total) by mouth daily. 06/05/14  Yes Leonie Man, MD   No Known Allergies   Social History   Social History  . Marital Status: Widowed    Spouse Name: N/A  . Number of Children: 8  . Years of Education: N/A   Occupational History  .     Social History Main Topics  . Smoking status: Former Smoker    Quit date: 07/12/1962  . Smokeless tobacco: Never Used     Comment: quit in 1964  . Alcohol Use: No  . Drug Use: No  . Sexual Activity: Not Asked   Other Topics Concern  . None   Social History Narrative   Family History  Problem Relation Age of Onset  . Cancer Son     colon  . Cancer Son     brain     Wt Readings from Last 3 Encounters:  05/07/15 187 lb (84.823 kg)  06/05/14 190 lb 4 oz (86.297 kg)  05/07/14 190 lb 8 oz (86.41 kg)    PHYSICAL EXAM BP 154/72 mmHg  Pulse 62  Ht 5\' 9"  (1.753 m)  Wt 187 lb (84.823 kg)  BMI 27.60 kg/m2 General appearance: alert, cooperative, appears stated age, no distress and Pleasant mood and affect.  HEENT: [place]/AT, EOMI, MMM, anicteric sclera] Neck: no adenopathy, no carotid bruit, no JVD, supple, symmetrical, trachea midline and . He remains but not distended  Lungs: clear to auscultation bilaterally, normal percussion bilaterally and Nonlabored, good air movement   Heart: normal apical impulse and RRR, normal S1 and S2 with a soft SM (1/6) along the sternal border. No other R./G.  Abdomen: soft, non-tender; bowel sounds normal; no masses, no organomegaly  Extremities: edema 2+ pitting to the mid calf, venous stasis dermatitis noted and Dry scaly skin consistent with PAD Pulses: Faint, barely palpable bilateral pedal pulses  Neurologic: Grossly normal   Adult ECG Report  Rate: 62 ;  Rhythm: Mostly a-V paced rhythm, but also noted ventricular paced beats.;   Narrative Interpretation: Relatively stable EKG   Other studies Reviewed: Additional studies/ records that were reviewed today include:  Recent Labs:  @ [place] / PLAN: Problem List Items Addressed This Visit    Paroxysmal atrial tachycardia (HCC) (Chronic)  Certainly not having any symptoms of PAT. Nothing noted on pacemaker interrogation.      Essential hypertension - Primary (Chronic)    Blood pressure is little elevated today. I'm allowing for low permissive hypertension. He is on amlodipine, but did not take a dose today. Would simply monitor.      Relevant Orders   EKG 12-Lead (Completed)   Complete heart block ([place])    Status post pacemaker placement. I do wonder if he may have low bit of the faculty with chronotropic response to simply because he doesn't move his arms a lot when he walks. Encouraged continued ambulation. He seems to be mostly 100% paced      Relevant Orders   EKG 12-Lead (Completed)   Bilateral leg edema (Chronic)    I really think this is related to venous stasis problems. He is on low-dose Lasix which he can take additional doses if necessary, however my recommendation will be to use support stockings. He just has not done it because it is difficult to wear.         Current medicines are reviewed at length with the patient today. (+/- concerns) none The following changes have been made: None Studies Ordered:   Orders Placed This  Encounter  Procedures  . EKG 12-Lead    Follow-up in one year    Rayshell Goecke W, M.D., M.S.  [employer]  [address] [place], [place] [address] [phone] Fax [phone]

## 2015-05-07 NOTE — Patient Instructions (Signed)
Medication Instructions:  Your physician recommends that you continue on your current medications as directed. Please refer to the Current Medication list given to you today.   Labwork: none  Testing/Procedures: none  Follow-Up: Your physician recommends that you schedule a follow-up appointment in: one year with Dr. Ellyn Hack.   Any Other Special Instructions Will Be Listed Below (If Applicable). Elevate legs and feet when possible. If unable to tolerate compression hose, please wear support stockings.      If you need a refill on your cardiac medications before your next appointment, please call your pharmacy.

## 2015-05-08 ENCOUNTER — Encounter: Payer: Self-pay | Admitting: Cardiology

## 2015-05-08 NOTE — Assessment & Plan Note (Signed)
I really think this is related to venous stasis problems. He is on low-dose Lasix which he can take additional doses if necessary, however my recommendation will be to use support stockings. He just has not done it because it is difficult to wear.

## 2015-05-08 NOTE — Assessment & Plan Note (Signed)
Certainly not having any symptoms of PAT. Nothing noted on pacemaker interrogation.

## 2015-05-08 NOTE — Assessment & Plan Note (Signed)
Blood pressure is little elevated today. I'm allowing for low permissive hypertension. He is on amlodipine, but did not take a dose today. Would simply monitor.

## 2015-05-08 NOTE — Assessment & Plan Note (Signed)
Status post pacemaker placement. I do wonder if he may have low bit of the faculty with chronotropic response to simply because he doesn't move his arms a lot when he walks. Encouraged continued ambulation. He seems to be mostly 100% paced

## 2015-07-15 ENCOUNTER — Ambulatory Visit (INDEPENDENT_AMBULATORY_CARE_PROVIDER_SITE_OTHER): Payer: Medicare Other | Admitting: Internal Medicine

## 2015-07-15 ENCOUNTER — Encounter: Payer: Self-pay | Admitting: Internal Medicine

## 2015-07-15 VITALS — BP 150/70 | HR 62 | Ht 69.0 in | Wt 185.5 lb

## 2015-07-15 DIAGNOSIS — I442 Atrioventricular block, complete: Secondary | ICD-10-CM

## 2015-07-15 DIAGNOSIS — Z950 Presence of cardiac pacemaker: Secondary | ICD-10-CM

## 2015-07-15 DIAGNOSIS — I10 Essential (primary) hypertension: Secondary | ICD-10-CM

## 2015-07-15 DIAGNOSIS — R0609 Other forms of dyspnea: Secondary | ICD-10-CM

## 2015-07-15 DIAGNOSIS — R600 Localized edema: Secondary | ICD-10-CM

## 2015-07-15 NOTE — Patient Instructions (Signed)
Medication Instructions:  Your physician has recommended you make the following change in your medication:  STOP taking amlodipine   Labwork: none  Testing/Procedures: none  Follow-Up: Your physician recommends that you schedule a follow-up appointment in: 4 weeks with Christell Faith, PA-C or Ignacia Bayley, NP Your physician wants you to follow-up in: one year with Dr. Caryl Comes.  You will receive a reminder letter in the mail two months in advance. If you don't receive a letter, please call our office to schedule the follow-up appointment.    Any Other Special Instructions Will Be Listed Below (If Applicable).     If you need a refill on your cardiac medications before your next appointment, please call your pharmacy.

## 2015-07-15 NOTE — Progress Notes (Signed)
Patient Care Team: Pcp Not In System as PCP - General   HPI  Danny Buck is a 82 y.o. male Seen in follow-up for pacemaker implanted May 2013 for complete heart block. ([place])  He has known peripheral vascular disease with prior revascularization. He has been followed by Dr. Hiram Comber.  Echocardiogram 5/13-[place] demonstrated ejection fraction of 60% Myoview scanning 6/14 demonstrated no ischemia and a "small apical scar"  He has been noted on prior device interrogation to have nonsustained ventricular tachycardia  His major problem is exercise intolerance. He just " gives out". He has chronic peripheral edema.  He does not have nocturnal dyspnea or orthopnea. He denies exertional chest discomfort. His edema has been partially responsive to diuretics. It is thought to be in part venostasis. Notably he is also on amlodipine;  blood pressures at home were recorded most recently by his daughter is a nurse at [employer]      Past Medical History  Diagnosis Date  . Peripheral arterial disease (HCC)     CT angiogram 08/26/2011 showed reduced ABIs bilaterally with ABI of 0.54on the right and 0.72 on the left -- s/p Bilat SFA Atherectomy & PTA-Stents (Dr. Gwenlyn Found);; Dopplers 07/2103 - RABI 0.68; LABI 0.77; patent SFA stents, Bilat ATA& PTA occlusion w/ 1 V runnoff (Peroneal A)  . Hypertension   . Complete heart block (HCC)     s/p PPM  . Pacemaker   . Hypertension   . Arthritis     Past Surgical History  Procedure Laterality Date  . Knee surgery    . Pacemaker insertion  09/09/2011    Adapta Versa DR Dual chamber  [employer]  last checked 11/10/2011,   . Insert / replace / remove pacemaker    . Femoral artery stent  8 & 01/2013    Bilateral SFA Rotational Atherectomy & Stent.  . Nm myoview ltd  01/2013    Small Inferoapical infarct; No ischemia; Normal EF  . Transthoracic echocardiogram  09/2011    EF 60%, otherwise normal  . Lower extremity angiogram Bilateral  12/12/2012    Procedure: LOWER EXTREMITY ANGIOGRAM;  Surgeon: Lorretta Harp, MD;  Location: [facility] CATH LAB;  Service: Cardiovascular;  Laterality: Bilateral;  . Percutaneous stent intervention Right 12/12/2012    Procedure: PERCUTANEOUS STENT INTERVENTION;  Surgeon: Lorretta Harp, MD;  Location: [facility] CATH LAB;  Service: Cardiovascular;  Laterality: Right;  atherectomy and stent to rt sfa  . Abdominal aortagram  12/12/2012    Procedure: ABDOMINAL AORTAGRAM;  Surgeon: Lorretta Harp, MD;  Location: [facility] CATH LAB;  Service: Cardiovascular;;  . Lower extremity angiogram N/A 02/13/2013    Procedure: LOWER EXTREMITY ANGIOGRAM;  Surgeon: Lorretta Harp, MD;  Location: [facility] CATH LAB;  Service: Cardiovascular;  Laterality: N/A;  . Percutaneous stent intervention Left 02/13/2013    Procedure: PERCUTANEOUS STENT INTERVENTION;  Surgeon: Lorretta Harp, MD;  Location: [facility] CATH LAB;  Service: Cardiovascular;  Laterality: Left;  lt sfa stent    Current Outpatient Prescriptions  Medication Sig Dispense Refill  . amLODipine (NORVASC) 10 MG tablet Take 0.5 tablets (5 mg total) by mouth daily. 90 tablet 3  . aspirin EC 81 MG tablet Take 81 mg by mouth daily.    . ferrous sulfate 325 (65 FE) MG tablet Take 325 mg by mouth daily with breakfast.    . furosemide (LASIX) 20 MG tablet Take 1 tablet (20 mg total) by mouth daily. 90 tablet 3   No current  facility-administered medications for this visit.    No Known Allergies  Review of Systems negative except from HPI and PMH  Physical Exam BP 150/70 mmHg  Pulse 62  Ht 5\' 9"  (1.753 m)  Wt 185 lb 8 oz (84.142 kg)  BMI 27.38 kg/m2 Well developed and well nourished in no acute distress HENT normal E scleral and icterus clear Neck Supple JVP flat; carotids brisk and full Clear to ausculation *Regular rate and rhythm, no murmurs gallops or rub Soft with active bowel sounds No clubbing cyanosis 2+ Edema Alert and oriented, grossly normal motor and sensory  function Skin Warm and Dry  Sinus rhythm at 86 with P synchronous pacing  Assessment and  Plan  Complete heart block  Pacemaker-Medtronic The patient's device was interrogated.  The information was reviewed. No changes were made in the programming.     Hypertension  Dyspnea on exertion  Dyspnea on exertion did not improve with inactivation rate response. Heart rate excursion remains adequate  He has significant edema. His blood pressures at home are not all that high. I wonder whether amlodipine is contributing to his symptoms and his edema. We will have him discontinue it for about 4 weeks. He'll follow up with PAs and we will reassess.  We spent more than 50% of our >25 min visit in face to face counseling regarding the above

## 2015-07-16 LAB — CUP PACEART INCLINIC DEVICE CHECK
Battery Remaining Longevity: 80 mo
Brady Statistic AS VP Percent: 32 %
Brady Statistic AS VS Percent: 5 %
Date Time Interrogation Session: 20170314155755
Implantable Lead Implant Date: 20130509
Implantable Lead Implant Date: 20130509
Lead Channel Impedance Value: 366 Ohm
Lead Channel Pacing Threshold Amplitude: 0.75 V
Lead Channel Pacing Threshold Pulse Width: 0.4 ms
Lead Channel Pacing Threshold Pulse Width: 0.4 ms
Lead Channel Setting Pacing Amplitude: 2 V
Lead Channel Setting Pacing Amplitude: 2.5 V
Lead Channel Setting Sensing Sensitivity: 2.8 mV
MDC IDC LEAD LOCATION: 753859
MDC IDC LEAD LOCATION: 753860
MDC IDC MSMT BATTERY IMPEDANCE: 501 Ohm
MDC IDC MSMT BATTERY VOLTAGE: 2.77 V
MDC IDC MSMT LEADCHNL RA IMPEDANCE VALUE: 387 Ohm
MDC IDC MSMT LEADCHNL RA PACING THRESHOLD AMPLITUDE: 0.5 V
MDC IDC MSMT LEADCHNL RA SENSING INTR AMPL: 1 mV
MDC IDC MSMT LEADCHNL RV SENSING INTR AMPL: 11.2 mV
MDC IDC SET LEADCHNL RV PACING PULSEWIDTH: 0.4 ms
MDC IDC STAT BRADY AP VP PERCENT: 63 %
MDC IDC STAT BRADY AP VS PERCENT: 0 %

## 2015-07-29 ENCOUNTER — Encounter: Payer: Self-pay | Admitting: Internal Medicine

## 2015-08-06 ENCOUNTER — Encounter: Payer: Self-pay | Admitting: Cardiology

## 2015-08-06 ENCOUNTER — Ambulatory Visit (INDEPENDENT_AMBULATORY_CARE_PROVIDER_SITE_OTHER): Payer: Medicare Other | Admitting: Cardiology

## 2015-08-06 VITALS — BP 178/70 | HR 62 | Ht 69.0 in | Wt 188.0 lb

## 2015-08-06 DIAGNOSIS — R600 Localized edema: Secondary | ICD-10-CM | POA: Diagnosis not present

## 2015-08-06 DIAGNOSIS — I471 Supraventricular tachycardia: Secondary | ICD-10-CM

## 2015-08-06 DIAGNOSIS — R5382 Chronic fatigue, unspecified: Secondary | ICD-10-CM | POA: Diagnosis not present

## 2015-08-06 DIAGNOSIS — R0609 Other forms of dyspnea: Secondary | ICD-10-CM | POA: Diagnosis not present

## 2015-08-06 DIAGNOSIS — I10 Essential (primary) hypertension: Secondary | ICD-10-CM

## 2015-08-06 MED ORDER — AMLODIPINE BESYLATE 2.5 MG PO TABS: 2.5000 mg | ORAL_TABLET | Freq: Every day | ORAL | Status: DC

## 2015-08-06 MED ORDER — FUROSEMIDE 20 MG PO TABS: 20.0000 mg | ORAL_TABLET | Freq: Every day | ORAL | Status: AC

## 2015-08-06 NOTE — Progress Notes (Signed)
PCP: Pcp Not In System  Clinic Note: Chief Complaint  Patient presents with  . Other    4 wk f/u c/o edema. Meds reviewed verbally with pt.    HPI: Danny Buck is a 82 y.o. male with a PMH below who presents today for 1 yr f/u of HTN, & 3rd Deg AVB-s/p PPM. He is a very elderly pleasant gentleman with significant PAD and lower extremity venous stasis who is status post pacemaker placement for complete heart block. He has chronic lower extremity edema - that has been managed somewhat with Lasix. That he is not been using compression stockings.Danny Buck was last seen by me on 05/07/2015 --> he noted having lack of energy and fatigue.  I recommended allowing permissive hypertension. Despite having edema he denies any PND or orthopnea. No recurrence of any A. fib symptoms are PAT symptoms.   In the interim, he has seen Dr. Caryl Comes from Electrophysiology - he also noted exercise intolerance and giving out. Pacemaker was interrogated and seemed normal. Despite inactivation the rate responsiveness, dyspnea on exertion did not improve. Amlodipine was stopped because of concern for exacerbating edema.  Recent Hospitalizations: none  Studies Reviewed:   Interval History:  Danny Buck presents today overall doing relatively well. He states that he is still doing everything he wants - just not as swiftly. He notes that he just Gets tired getting dressed.  Just not a lot of Energy.  His edema is relatively stable, but he does note that after a few hours of lying down he starts having a toothache like pain in his legs. He has not actually ever worn support stockings, despite this being recommended. He denies any PND or orthopnea to go along with edema.    No chest pain with rest or exertion, But he does get short of breath when he exerts himself. He describes as more fatigue and dyspnea.  No PND or orthopnea.   No palpitations, lightheadedness, dizziness, weakness or syncope/near  syncope. No TIA/amaurosis fugax symptoms. No melena, hematochezia, hematuria, or epstaxis. No claudication.  ROS: A comprehensive was performed. Review of Systems  Constitutional: Positive for malaise/fatigue (poor energy & exercise tolerance).  HENT: Negative for nosebleeds.   Eyes: Negative for double vision.  Respiratory: Negative for cough and wheezing.   Cardiovascular: Positive for leg swelling. Negative for orthopnea, claudication and PND.  Gastrointestinal: Negative for blood in stool and melena.  Genitourinary: Negative for hematuria.  Musculoskeletal: Positive for back pain. Negative for myalgias.  Neurological: Negative for dizziness, sensory change, speech change, focal weakness, loss of consciousness and weakness.  Endo/Heme/Allergies: Does not bruise/bleed easily.  Psychiatric/Behavioral: Negative.   All other systems reviewed and are negative.   Past Medical History  Diagnosis Date  . Peripheral arterial disease (HCC)     CT angiogram 08/26/2011 showed reduced ABIs bilaterally with ABI of 0.54on the right and 0.72 on the left -- s/p Bilat SFA Atherectomy & PTA-Stents (Dr. Gwenlyn Found);; Dopplers 07/2103 - RABI 0.68; LABI 0.77; patent SFA stents, Bilat ATA& PTA occlusion w/ 1 V runnoff (Peroneal A)  . Hypertension   . Complete heart block (HCC)     s/p PPM  . Pacemaker   . Hypertension   . Arthritis     Past Surgical History  Procedure Laterality Date  . Knee surgery    . Pacemaker insertion  09/09/2011    Adapta Versa DR Dual chamber  [employer]  last checked 11/10/2011,   . Insert / replace /  remove pacemaker    . Femoral artery stent  8 & 01/2013    Bilateral SFA Rotational Atherectomy & Stent.  . Nm myoview ltd  01/2013    Small Inferoapical infarct; No ischemia; Normal EF  . Transthoracic echocardiogram  09/2011    EF 60%, otherwise normal  . Lower extremity angiogram Bilateral 12/12/2012    Procedure: LOWER EXTREMITY ANGIOGRAM;  Surgeon: Lorretta Harp, MD;   Location: [facility] CATH LAB;  Service: Cardiovascular;  Laterality: Bilateral;  . Percutaneous stent intervention Right 12/12/2012    Procedure: PERCUTANEOUS STENT INTERVENTION;  Surgeon: Lorretta Harp, MD;  Location: [facility] CATH LAB;  Service: Cardiovascular;  Laterality: Right;  atherectomy and stent to rt sfa  . Abdominal aortagram  12/12/2012    Procedure: ABDOMINAL AORTAGRAM;  Surgeon: Lorretta Harp, MD;  Location: [facility] CATH LAB;  Service: Cardiovascular;;  . Lower extremity angiogram N/A 02/13/2013    Procedure: LOWER EXTREMITY ANGIOGRAM;  Surgeon: Lorretta Harp, MD;  Location: [facility] CATH LAB;  Service: Cardiovascular;  Laterality: N/A;  . Percutaneous stent intervention Left 02/13/2013    Procedure: PERCUTANEOUS STENT INTERVENTION;  Surgeon: Lorretta Harp, MD;  Location: [facility] CATH LAB;  Service: Cardiovascular;  Laterality: Left;  lt sfa stent   Prior to Admission medications   Medication Sig Start Date End Date Taking? Authorizing Provider  amLODipine (NORVASC) 10 MG tablet Take 0.5 tablets (5 mg total) by mouth daily. 12/10/14  Yes Leonie Man, MD  aspirin EC 81 MG tablet Take 81 mg by mouth daily.   Yes Historical Provider, MD  ferrous sulfate 325 (65 FE) MG tablet Take 325 mg by mouth daily with breakfast.   Yes Historical Provider, MD  furosemide (LASIX) 20 MG tablet Take 1 tablet (20 mg total) by mouth daily. 06/05/14  Yes Leonie Man, MD   No Known Allergies   Social History   Social History  . Marital Status: Widowed    Spouse Name: N/A  . Number of Children: 8  . Years of Education: N/A   Occupational History  .     Social History Main Topics  . Smoking status: Former Smoker    Quit date: 07/12/1962  . Smokeless tobacco: Never Used     Comment: quit in 1964  . Alcohol Use: No  . Drug Use: No  . Sexual Activity: Not Asked   Other Topics Concern  . None   Social History Narrative   Family History  Problem Relation Age of Onset  . Cancer Son     colon  .  Cancer Son     brain     Wt Readings from Last 3 Encounters:  08/06/15 188 lb (85.276 kg)  07/15/15 185 lb 8 oz (84.142 kg)  05/07/15 187 lb (84.823 kg)    PHYSICAL EXAM BP 178/70 mmHg  Pulse 62  Ht 5\' 9"  (1.753 m)  Wt 188 lb (85.276 kg)  BMI 27.75 kg/m2 General appearance: alert, cooperative, appears stated age, no distress and Pleasant mood and affect.  HEENT: [place]/AT, EOMI, MMM, anicteric sclera] Neck: no adenopathy, no carotid bruit, no JVD, supple, symmetrical, trachea midline and . He remains but not distended  Lungs: clear to auscultation bilaterally, normal percussion bilaterally and Nonlabored, good air movement  Heart: normal apical impulse and RRR, normal S1 and S2 with a soft SM (1/6) along the sternal border. No other R./G.  Abdomen: soft, non-tender; bowel sounds normal; no masses, no organomegaly  Extremities: edema 2+ pitting to  the mid calf, venous stasis dermatitis noted and Dry scaly skin consistent with PAD Pulses: Faint, barely palpable bilateral pedal pulses  Neurologic: Grossly normal   Adult ECG Report  Rate: 62 ;  Rhythm: Mostly a-V paced rhythm, but also noted ventricular paced beats.;   Narrative Interpretation: Relatively stable EKG   Other studies Reviewed: Additional studies/ records that were reviewed today include:  Recent Labs:  @ [place] / PLAN: Problem List Items Addressed This Visit    Paroxysmal atrial tachycardia ([place]) - Primary (Chronic)    As far as I can tell, not having any further episodes. Nothing noted on pacemaker interrogation. I would be reluctant to use beta blockers for fear that it would worsen  his fatigue.      Relevant Medications   furosemide (LASIX) 20 MG tablet   amLODipine (NORVASC) 2.5 MG tablet   Other Relevant Orders   EKG 12-Lead (Completed)   Fatigue    I'm not sure what to make of fatigue. I think this may just simply be from deconditioning and age. He is not enough medicines to make it  related to medicine. Apparently changing his pacemaker did not help either. I just encouraged him to continue to stay active and try to exercise. The more he exercises, nor his energy should return. That should also help his edema.      Essential hypertension (Chronic)    Quite elevated blood pressure today, but is not on his Lasix. Although improved. Apparently home it has been better than that. They'll have a lot of options to use now that amlodipine is been stopped. Since retried battle his fatigue, I'm inclined to allow him to have permissive hypertension as long as he is not symptomatically with headaches or significant dyspnea. The patient and his son understand there is there are risks of allowing for some hypertension.      Relevant Medications   furosemide (LASIX) 20 MG tablet   amLODipine (NORVASC) 2.5 MG tablet   DOE (dyspnea on exertion)    He may have a slight volume overload issue leading to some diastolic dysfunction now. I will have him increase his Lasix for a couple days. We talked about when necessary titration      Bilateral leg edema (Chronic)    Again I think this is related to venous stasis since he doesn't really have any other heart failure symptoms associated with it. For some reason he understood that he is postop open amlodipine and Lasix after his visit with Dr. Caryl Comes. Amlodipine was stopped as an antihypertensive for concern that it may be worsening edema. He been on amlodipine for long time and it did not make a difference. My plan is for him to double up his Lasix every so often including for the next 2-3 days to see if that can potentially help with some of his edema and exertional dyspnea.         Current medicines are reviewed at length with the patient today. (+/- concerns) none  Medication Instructions:  Your physician has recommended you make the following change in your medication:  START taking lasix. Take 40mg  (2 tablets) once daily for 2 days then  take 20mg  (1 tablet) once daily TAKE amlodipine for systolic blood pressure (top number) greater than 170   Studies Ordered:   Orders Placed This Encounter  Procedures  . EKG 12-Lead    Follow-up in 6 months    HARDING,DAVID W, M.D., M.S.  [employer]  [address] [place] [place], [place] [address] [phone] Fax [phone]

## 2015-08-06 NOTE — Patient Instructions (Signed)
Medication Instructions:  Your physician has recommended you make the following change in your medication:  START taking lasix. Take 40mg  (2 tablets) once daily for 2 days then take 20mg  (1 tablet) once daily TAKE amlodipine for systolic blood pressure (top number) greater than 170   Labwork: none  Testing/Procedures: none  Follow-Up: Your physician wants you to follow-up in: six months with Dr. Ellyn Hack.  You will receive a reminder letter in the mail two months in advance. If you don't receive a letter, please call our office to schedule the follow-up appointment.   Any Other Special Instructions Will Be Listed Below (If Applicable). Please use ace bandages to wrap legs from the knee down. Elevate your legs above your heart several times a day.      If you need a refill on your cardiac medications before your next appointment, please call your pharmacy.

## 2015-08-06 NOTE — Progress Notes (Signed)
PCP: Pcp Not In System  Clinic Note: Chief Complaint  Patient presents with  . Other    4 wk f/u c/o edema. Meds reviewed verbally with pt.    HPI: Danny Buck is a 82 y.o. male with a PMH below who presents today for 1 yr f/u of HTN, & 3rd Deg AVB-s/p PPM. He is a very elderly pleasant gentleman with significant PAD and lower extremity venous stasis who is status post pacemaker placement for complete heart block. I saw him in June 2015 then again in September 2015 having noted improvement of his edema while on Lasix.  He did not start using support hose.  Danny Buck was last seen on Jun 05 2014.  Recent Hospitalizations: none  Studies Reviewed: Last PPM Dx = Nov 2016 - normal  Interval History: Danny Buck presents today overall doing relatively well. He states that he is still doing everything he wants - just not as swiftly. He notes that he just Gets tired getting dressed.  Just not a lot of Energy.  His edema is relatively stable, but he does note that after a few hours of lying down he starts having a toothache like pain in his legs. He has not actually ever worn support stockings, despite this being recommended. He denies any PND or orthopnea to go along with edema.    No chest pain or shortness of breath with rest or exertion.   No palpitations, lightheadedness, dizziness, weakness or syncope/near syncope. No TIA/amaurosis fugax symptoms. No melena, hematochezia, hematuria, or epstaxis. No claudication.  ROS: A comprehensive was performed. Review of Systems  Constitutional: Positive for malaise/fatigue (poor energy & exercise tolerance).  HENT: Negative for nosebleeds.   Eyes: Negative for double vision.  Respiratory: Negative for cough.   Cardiovascular: Positive for leg swelling. Negative for orthopnea, claudication and PND.  Gastrointestinal: Negative for blood in stool and melena.  Genitourinary: Negative for hematuria.  Neurological: Negative for  dizziness, sensory change, speech change, focal weakness, loss of consciousness and weakness.  Endo/Heme/Allergies: Does not bruise/bleed easily.  All other systems reviewed and are negative.   Past Medical History  Diagnosis Date  . Peripheral arterial disease (HCC)     CT angiogram 08/26/2011 showed reduced ABIs bilaterally with ABI of 0.54on the right and 0.72 on the left -- s/p Bilat SFA Atherectomy & PTA-Stents (Dr. Gwenlyn Found);; Dopplers 07/2103 - RABI 0.68; LABI 0.77; patent SFA stents, Bilat ATA& PTA occlusion w/ 1 V runnoff (Peroneal A)  . Hypertension   . Complete heart block (HCC)     s/p PPM  . Pacemaker   . Hypertension   . Arthritis     Past Surgical History  Procedure Laterality Date  . Knee surgery    . Pacemaker insertion  09/09/2011    Adapta Versa DR Dual chamber  [employer]  last checked 11/10/2011,   . Insert / replace / remove pacemaker    . Femoral artery stent  8 & 01/2013    Bilateral SFA Rotational Atherectomy & Stent.  . Nm myoview ltd  01/2013    Small Inferoapical infarct; No ischemia; Normal EF  . Transthoracic echocardiogram  09/2011    EF 60%, otherwise normal  . Lower extremity angiogram Bilateral 12/12/2012    Procedure: LOWER EXTREMITY ANGIOGRAM;  Surgeon: Lorretta Harp, MD;  Location: [facility] CATH LAB;  Service: Cardiovascular;  Laterality: Bilateral;  . Percutaneous stent intervention Right 12/12/2012    Procedure: PERCUTANEOUS STENT INTERVENTION;  Surgeon: Lorretta Harp, MD;  Location: [facility] CATH LAB;  Service: Cardiovascular;  Laterality: Right;  atherectomy and stent to rt sfa  . Abdominal aortagram  12/12/2012    Procedure: ABDOMINAL AORTAGRAM;  Surgeon: Lorretta Harp, MD;  Location: [facility] CATH LAB;  Service: Cardiovascular;;  . Lower extremity angiogram N/A 02/13/2013    Procedure: LOWER EXTREMITY ANGIOGRAM;  Surgeon: Lorretta Harp, MD;  Location: [facility] CATH LAB;  Service: Cardiovascular;  Laterality: N/A;  . Percutaneous stent intervention Left  02/13/2013    Procedure: PERCUTANEOUS STENT INTERVENTION;  Surgeon: Lorretta Harp, MD;  Location: [facility] CATH LAB;  Service: Cardiovascular;  Laterality: Left;  lt sfa stent   Prior to Admission medications   Medication Sig Start Date End Date Taking? Authorizing Provider  amLODipine (NORVASC) 10 MG tablet Take 0.5 tablets (5 mg total) by mouth daily. 12/10/14  Yes Leonie Man, MD  aspirin EC 81 MG tablet Take 81 mg by mouth daily.   Yes Historical Provider, MD  ferrous sulfate 325 (65 FE) MG tablet Take 325 mg by mouth daily with breakfast.   Yes Historical Provider, MD  furosemide (LASIX) 20 MG tablet Take 1 tablet (20 mg total) by mouth daily. 06/05/14  Yes Leonie Man, MD   No Known Allergies   Social History   Social History  . Marital Status: Widowed    Spouse Name: N/A  . Number of Children: 8  . Years of Education: N/A   Occupational History  .     Social History Main Topics  . Smoking status: Former Smoker    Quit date: 07/12/1962  . Smokeless tobacco: Never Used     Comment: quit in 1964  . Alcohol Use: No  . Drug Use: No  . Sexual Activity: Not Asked   Other Topics Concern  . None   Social History Narrative   Family History  Problem Relation Age of Onset  . Cancer Son     colon  . Cancer Son     brain     Wt Readings from Last 3 Encounters:  08/06/15 188 lb (85.276 kg)  07/15/15 185 lb 8 oz (84.142 kg)  05/07/15 187 lb (84.823 kg)    PHYSICAL EXAM BP 178/70 mmHg  Pulse 62  Ht 5\' 9"  (1.753 m)  Wt 188 lb (85.276 kg)  BMI 27.75 kg/m2 General appearance: alert, cooperative, appears stated age, no distress and Pleasant mood and affect.  HEENT: [place]/AT, EOMI, MMM, anicteric sclera] Neck: no adenopathy, no carotid bruit, no JVD, supple, symmetrical, trachea midline and . He remains but not distended  Lungs: clear to auscultation bilaterally, normal percussion bilaterally and Nonlabored, good air movement  Heart: normal apical impulse and RRR,  normal S1 and S2 with a soft SM (1/6) along the sternal border. No other R./G.  Abdomen: soft, non-tender; bowel sounds normal; no masses, no organomegaly  Extremities: edema 2+ pitting to the mid calf, venous stasis dermatitis noted and Dry scaly skin consistent with PAD Pulses: Faint, barely palpable bilateral pedal pulses  Neurologic: Grossly normal   Adult ECG Report  Rate: 62 ;  Rhythm: Mostly a-V paced rhythm, but also noted ventricular paced beats.;   Narrative Interpretation: Relatively stable EKG   Other studies Reviewed: Additional studies/ records that were reviewed today include:  Recent Labs:  @ [place] / PLAN: Problem List Items Addressed This Visit    Paroxysmal atrial tachycardia ([place]) - Primary (Chronic)   Relevant Orders   EKG 12-Lead  Current medicines are reviewed at length with the patient today. (+/- concerns) none The following changes have been made: None Studies Ordered:   Orders Placed This Encounter  Procedures  . EKG 12-Lead    Follow-up in one year    Arie Gable W, M.D., M.S.  [employer]  [address] [place], [place] [address] [phone] Fax [phone]

## 2015-08-08 NOTE — Assessment & Plan Note (Signed)
As far as I can tell, not having any further episodes. Nothing noted on pacemaker interrogation. I would be reluctant to use beta blockers for fear that it would worsen  his fatigue.

## 2015-08-08 NOTE — Assessment & Plan Note (Signed)
Quite elevated blood pressure today, but is not on his Lasix. Although improved. Apparently home it has been better than that. They'll have a lot of options to use now that amlodipine is been stopped. Since retried battle his fatigue, I'm inclined to allow him to have permissive hypertension as long as he is not symptomatically with headaches or significant dyspnea. The patient and his son understand there is there are risks of allowing for some hypertension.

## 2015-08-08 NOTE — Assessment & Plan Note (Signed)
He may have a slight volume overload issue leading to some diastolic dysfunction now. I will have him increase his Lasix for a couple days. We talked about when necessary titration

## 2015-08-08 NOTE — Assessment & Plan Note (Signed)
Again I think this is related to venous stasis since he doesn't really have any other heart failure symptoms associated with it. For some reason he understood that he is postop open amlodipine and Lasix after his visit with Dr. Caryl Comes. Amlodipine was stopped as an antihypertensive for concern that it may be worsening edema. He been on amlodipine for long time and it did not make a difference. My plan is for him to double up his Lasix every so often including for the next 2-3 days to see if that can potentially help with some of his edema and exertional dyspnea.

## 2015-08-08 NOTE — Assessment & Plan Note (Signed)
I'm not sure what to make of fatigue. I think this may just simply be from deconditioning and age. He is not enough medicines to make it related to medicine. Apparently changing his pacemaker did not help either. I just encouraged him to continue to stay active and try to exercise. The more he exercises, nor his energy should return. That should also help his edema.

## 2015-10-14 ENCOUNTER — Ambulatory Visit (INDEPENDENT_AMBULATORY_CARE_PROVIDER_SITE_OTHER): Payer: Medicare Other | Admitting: *Deleted

## 2015-10-14 ENCOUNTER — Telehealth: Payer: Self-pay | Admitting: Cardiology

## 2015-10-14 DIAGNOSIS — I442 Atrioventricular block, complete: Secondary | ICD-10-CM

## 2015-10-14 NOTE — Telephone Encounter (Signed)
Confirmed remote transmission w/ pt son.    

## 2015-10-14 NOTE — Progress Notes (Signed)
Remote pacemaker transmission.   

## 2015-10-16 ENCOUNTER — Encounter: Payer: Self-pay | Admitting: Internal Medicine

## 2015-10-16 ENCOUNTER — Telehealth: Payer: Self-pay | Admitting: *Deleted

## 2015-10-16 ENCOUNTER — Encounter: Payer: Self-pay | Admitting: Cardiology

## 2015-10-16 NOTE — Telephone Encounter (Signed)
Patient's daughter reports that patient has grown progressively weaker over the past week and now needs assistance for ADLs.  As of last Tuesday, patient was still independent with most ADLs.  He now requires 24/7 assistance.  He spends most of his day sitting in his recliner now.  His cough has been present for ~3 months, but has worsened in the past week.  He is now "coughing his head off."  Patient's daughter reports that the ED at the [facility] hospital told them that patient's recent CXR "looked good."  Advised that patient's PPM is functioning appropriately for him, so these symptoms are likely not related to his PPM.  Advised that patient should follow up with PCP at the [facility] for assistance with managing the non-cardiac aspects of his care.  Advised that I will route call to Dr. Ellyn Hack for any additional recommendations.  Additionally, reviewed recommendations with daughter that Ovid Curd, RN had given to patient earlier this morning.  Patient's daughter verbalizes understanding and denies additional questions at this time.

## 2015-10-16 NOTE — Telephone Encounter (Signed)
I called patient and spoke w/ him. He reports he's had a cough w/ chronicity for about 3 months, "feeling tired", notes these symptoms aren't new and that although he feels poorly, he actually feels a little better than he did a month or two ago. The patient did want to know if his device interrogation sent yesterday showed anything concerning. He is aware I will route this inquiry to device clinic. He is requesting a call back with any information.  Pt has been given instruction by Dr. Ellyn Hack on PRN increases of his lasix for leg swelling, and I readdressed and clarified these instructions today. Patient is aware to present to the ED for any increase in SOB or onset of chest pain, and to call if new concerns or symptoms.

## 2015-10-16 NOTE — Telephone Encounter (Signed)
Not sure what is making him so tired.   CXR did not suggest pulmonary edema, but with cough, increasing Lasix to BID for 2-3 days may help.  Glenetta Hew, MD

## 2015-10-16 NOTE — Telephone Encounter (Signed)
Spoke with patient's son.  He states that he thinks his sister (patient's daughter) sent the MyChart message, but that I can speak with him and he will relay the message.  Advised that patient's PPM remote from 10/14/15 was normal for the patient and that the PPM is most likely not contributing to the patient's fatigue.  Reviewed OV notes from Dr. Ellyn Hack and Dr. Caryl Comes and Dr. Allison Quarry notes indicate that the fatigue may be related to deconditioning and age.  Patient's son verbalizes understanding and states that the fatigue and weakness is an ongoing issue and not something new.  He states that he knows it is likely related to the patient being 82 years old.  Patient's son is aware to call with worsening cardiac symptoms, questions or concerns.  He is appreciative of information and agrees to relay the information to his sister.  He denies additional questions or concerns at this time.

## 2015-10-17 NOTE — Telephone Encounter (Signed)
Called patient's daughter, Loletha Carrow, with recommendations to increase Lasix to BID for 2-3 days.  Vickie verbalizes understanding of instructions and is appreciative of call.  She is aware to call our office or seek emergency medical attention if patient experiences worsening cardiac symptoms.

## 2015-10-21 LAB — CUP PACEART REMOTE DEVICE CHECK
Brady Statistic AP VP Percent: 48 %
Brady Statistic AP VS Percent: 1 %
Brady Statistic AS VP Percent: 45 %
Date Time Interrogation Session: 20170613172805
Implantable Lead Implant Date: 20130509
Implantable Lead Location: 753859
Implantable Lead Model: 5076
Lead Channel Impedance Value: 358 Ohm
Lead Channel Pacing Threshold Amplitude: 0.5 V
Lead Channel Pacing Threshold Amplitude: 0.625 V
Lead Channel Pacing Threshold Pulse Width: 0.4 ms
Lead Channel Pacing Threshold Pulse Width: 0.4 ms
Lead Channel Sensing Intrinsic Amplitude: 1 mV
Lead Channel Setting Sensing Sensitivity: 2.8 mV
MDC IDC LEAD IMPLANT DT: 20130509
MDC IDC LEAD LOCATION: 753860
MDC IDC MSMT BATTERY IMPEDANCE: 577 Ohm
MDC IDC MSMT BATTERY REMAINING LONGEVITY: 67 mo
MDC IDC MSMT BATTERY VOLTAGE: 2.77 V
MDC IDC MSMT LEADCHNL RA IMPEDANCE VALUE: 392 Ohm
MDC IDC SET LEADCHNL RA PACING AMPLITUDE: 2 V
MDC IDC SET LEADCHNL RV PACING AMPLITUDE: 2.5 V
MDC IDC SET LEADCHNL RV PACING PULSEWIDTH: 0.4 ms
MDC IDC STAT BRADY AS VS PERCENT: 7 %

## 2015-10-22 ENCOUNTER — Encounter: Payer: Self-pay | Admitting: Cardiology

## 2015-11-05 ENCOUNTER — Encounter: Payer: Self-pay | Admitting: Cardiology

## 2016-01-13 ENCOUNTER — Telehealth: Payer: Self-pay | Admitting: Cardiology

## 2016-01-13 ENCOUNTER — Ambulatory Visit (INDEPENDENT_AMBULATORY_CARE_PROVIDER_SITE_OTHER): Payer: Medicare Other | Admitting: *Deleted

## 2016-01-13 DIAGNOSIS — I442 Atrioventricular block, complete: Secondary | ICD-10-CM

## 2016-01-13 NOTE — Telephone Encounter (Signed)
LMOVM reminding pt to send remote transmission.   

## 2016-01-13 NOTE — Progress Notes (Signed)
Remote pacemaker transmission.   

## 2016-01-15 ENCOUNTER — Encounter: Payer: Self-pay | Admitting: Cardiology

## 2016-01-22 LAB — CUP PACEART REMOTE DEVICE CHECK
Battery Remaining Longevity: 59 mo
Brady Statistic AS VS Percent: 7 %
Implantable Lead Implant Date: 20130509
Implantable Lead Location: 753859
Implantable Lead Location: 753860
Lead Channel Impedance Value: 352 Ohm
Lead Channel Impedance Value: 367 Ohm
Lead Channel Pacing Threshold Amplitude: 0.75 V
Lead Channel Pacing Threshold Pulse Width: 0.4 ms
Lead Channel Setting Pacing Amplitude: 2 V
Lead Channel Setting Pacing Pulse Width: 0.4 ms
MDC IDC LEAD IMPLANT DT: 20130509
MDC IDC MSMT BATTERY IMPEDANCE: 734 Ohm
MDC IDC MSMT BATTERY VOLTAGE: 2.77 V
MDC IDC MSMT LEADCHNL RA PACING THRESHOLD AMPLITUDE: 0.625 V
MDC IDC MSMT LEADCHNL RA PACING THRESHOLD PULSEWIDTH: 0.4 ms
MDC IDC MSMT LEADCHNL RA SENSING INTR AMPL: 1 mV
MDC IDC SESS DTM: 20170912151909
MDC IDC SET LEADCHNL RV PACING AMPLITUDE: 2.5 V
MDC IDC SET LEADCHNL RV SENSING SENSITIVITY: 2.8 mV
MDC IDC STAT BRADY AP VP PERCENT: 41 %
MDC IDC STAT BRADY AP VS PERCENT: 1 %
MDC IDC STAT BRADY AS VP PERCENT: 52 %

## 2016-04-13 ENCOUNTER — Ambulatory Visit (INDEPENDENT_AMBULATORY_CARE_PROVIDER_SITE_OTHER): Payer: Medicare Other | Admitting: *Deleted

## 2016-04-13 ENCOUNTER — Telehealth: Payer: Self-pay | Admitting: Cardiology

## 2016-04-13 DIAGNOSIS — I442 Atrioventricular block, complete: Secondary | ICD-10-CM | POA: Diagnosis not present

## 2016-04-13 NOTE — Telephone Encounter (Signed)
LMOVM reminding pt to send remote transmission.   

## 2016-04-13 NOTE — Progress Notes (Signed)
Remote pacemaker transmission.   

## 2016-04-21 ENCOUNTER — Encounter: Payer: Self-pay | Admitting: Cardiology

## 2016-04-27 LAB — CUP PACEART REMOTE DEVICE CHECK
Battery Impedance: 782 Ohm
Battery Voltage: 2.77 V
Brady Statistic AP VP Percent: 41 %
Brady Statistic AP VS Percent: 1 %
Brady Statistic AS VP Percent: 52 %
Brady Statistic AS VS Percent: 6 %
Implantable Lead Implant Date: 20130509
Implantable Lead Model: 5076
Lead Channel Impedance Value: 366 Ohm
Lead Channel Impedance Value: 372 Ohm
Lead Channel Pacing Threshold Amplitude: 0.5 V
Lead Channel Pacing Threshold Pulse Width: 0.4 ms
Lead Channel Pacing Threshold Pulse Width: 0.4 ms
Lead Channel Sensing Intrinsic Amplitude: 1.4 mV
Lead Channel Setting Sensing Sensitivity: 2.8 mV
MDC IDC LEAD IMPLANT DT: 20130509
MDC IDC LEAD LOCATION: 753859
MDC IDC LEAD LOCATION: 753860
MDC IDC MSMT BATTERY REMAINING LONGEVITY: 57 mo
MDC IDC MSMT LEADCHNL RV PACING THRESHOLD AMPLITUDE: 0.75 V
MDC IDC PG IMPLANT DT: 20130509
MDC IDC SESS DTM: 20171212175253
MDC IDC SET LEADCHNL RA PACING AMPLITUDE: 2 V
MDC IDC SET LEADCHNL RV PACING AMPLITUDE: 2.5 V
MDC IDC SET LEADCHNL RV PACING PULSEWIDTH: 0.4 ms

## 2016-05-06 ENCOUNTER — Encounter: Payer: Self-pay | Admitting: Podiatry

## 2016-05-06 ENCOUNTER — Ambulatory Visit (INDEPENDENT_AMBULATORY_CARE_PROVIDER_SITE_OTHER): Payer: Medicare Other | Admitting: Podiatry

## 2016-05-06 VITALS — BP 136/50 | HR 59

## 2016-05-06 DIAGNOSIS — L608 Other nail disorders: Secondary | ICD-10-CM

## 2016-05-06 DIAGNOSIS — B351 Tinea unguium: Secondary | ICD-10-CM | POA: Diagnosis not present

## 2016-05-06 DIAGNOSIS — L603 Nail dystrophy: Secondary | ICD-10-CM

## 2016-05-06 DIAGNOSIS — M79609 Pain in unspecified limb: Secondary | ICD-10-CM | POA: Diagnosis not present

## 2016-05-07 ENCOUNTER — Encounter: Payer: Self-pay | Admitting: Podiatry

## 2016-05-07 ENCOUNTER — Telehealth: Payer: Self-pay | Admitting: *Deleted

## 2016-05-07 NOTE — Telephone Encounter (Signed)
I spoke with pt and informed him that someone had sent an electronic message on his behalf and that I was trying to get in touch with that person. Pt asked Venora Maples to speak with me, and Venora Maples states it was probably Peggyann Shoals that had sent the message. I asked if it would be okay for me to speak with her concerning pt and Venora Maples agreed. I spoke with Peggyann Shoals and informed her of Dr. Amalia Hailey email information to me and his request for information as well. I told Vickie that Dr. Amalia Hailey wanted to make sure pt was being evaluated by a vascular doctor, she stated he had been seen by a [facility] doctor, but after hospitalization for colon cancer, he is seeing a [facility] doctor for his circulation. I told pt that Dr. Amalia Hailey had said that he had performed a routine nail trim on pt's long thick toenails, and that there were small sores on the nail plate at the level of the nail bed and that the neosporin and bandaids would be fine to treat, the best way to prevent the sores is routine nail care and I explained that Medicare allowed this every 61 days. I also explained that the pt and family should look for signs of infection to include redness, heat, swelling, streaking to the areas and fever. I told Vickie that Dr. Amalia Hailey had not seen any lesions between pt's toes and the darkness was due to long standing history of PVD and skin discoloration. I told Vickie that if pt or family had any concerns to let us know and we would have them back in as soon as possible. Vickie states understanding and would call as needed.

## 2016-05-07 NOTE — Telephone Encounter (Signed)
THANK YOU

## 2016-05-07 NOTE — Telephone Encounter (Signed)
I saw patient yesterday and did a routine nail trim. Please ensure that the patient is being followed by vascular to optimize his circulation. If he has not been seen by vascular we may need to set up a referral for him.  When I trimmed his nails, which were incredibly thick and hadn't been trimmed for a very long time, there were small sores under his nail plate at the level of the nail bed. Neosporin and a bandaid will be fine. The best way to prevent this is ROUTINE nail care. Signs to look for are typical signs of infection (red, hot, swollen, streaking, fever, etc.) Last, there were no open lesions between his toes that I noticed. The darkness is likely due to long standing history of PVD and skin discoloration. I did not observe anything alarming. The patient and caregiver present did not even mention this to me at the time of evaluation.  If they are deeply concerned they can come back in and we will fit them into the schedule asap.  I think that covered everything. Thanks, Dr. Amalia Hailey

## 2016-05-09 NOTE — Progress Notes (Signed)
   SUBJECTIVE Patient  presents to office today complaining of elongated, thickened nails. Pain while ambulating in shoes. Patient is unable to trim their own nails.   OBJECTIVE General Patient is awake, alert, and oriented x 3 and in no acute distress. Derm Skin is dry and supple bilateral. Negative open lesions or macerations. Remaining integument unremarkable. Nails are tender, long, thickened and dystrophic with subungual debris, consistent with onychomycosis, 1-5 bilateral. No signs of infection noted. Vasc  DP and PT pedal pulses palpable bilaterally. Temperature gradient within normal limits.  Neuro Epicritic and protective threshold sensation diminished bilaterally.  Musculoskeletal Exam No symptomatic pedal deformities noted bilateral. Muscular strength within normal limits.  ASSESSMENT 1. Onychodystrophic nails 1-5 bilateral with hyperkeratosis of nails.  2. Onychomycosis of nail due to dermatophyte bilateral 3. Pain in foot bilateral  PLAN OF CARE 1. Patient evaluated today.  2. Instructed to maintain good pedal hygiene and foot care.  3. Mechanical debridement of nails 1-5 bilaterally performed using a nail nipper. Filed with dremel without incident.  4. Return to clinic in 3 mos.    Brent M. Evans, DPM [facility]  Dr. Brent M. Evans, DPM    [address]                                        , [place] [address]                Office [phone]  Fax [phone]      

## 2016-05-13 ENCOUNTER — Encounter (HOSPITAL_COMMUNITY): Payer: Self-pay

## 2016-05-13 ENCOUNTER — Emergency Department (HOSPITAL_COMMUNITY)
Admission: EM | Admit: 2016-05-13 | Discharge: 2016-05-14 | Disposition: A | Discharge status: Home / Self Care | Payer: Medicare Other | Attending: Emergency Medicine | Admitting: Emergency Medicine

## 2016-05-13 DIAGNOSIS — I10 Essential (primary) hypertension: Secondary | ICD-10-CM | POA: Insufficient documentation

## 2016-05-13 DIAGNOSIS — M7989 Other specified soft tissue disorders: Secondary | ICD-10-CM | POA: Diagnosis present

## 2016-05-13 DIAGNOSIS — Z950 Presence of cardiac pacemaker: Secondary | ICD-10-CM | POA: Insufficient documentation

## 2016-05-13 DIAGNOSIS — Z85038 Personal history of other malignant neoplasm of large intestine: Secondary | ICD-10-CM | POA: Diagnosis not present

## 2016-05-13 DIAGNOSIS — N3001 Acute cystitis with hematuria: Secondary | ICD-10-CM | POA: Diagnosis not present

## 2016-05-13 DIAGNOSIS — Z79899 Other long term (current) drug therapy: Secondary | ICD-10-CM | POA: Insufficient documentation

## 2016-05-13 DIAGNOSIS — Z87891 Personal history of nicotine dependence: Secondary | ICD-10-CM | POA: Diagnosis not present

## 2016-05-13 DIAGNOSIS — Z7982 Long term (current) use of aspirin: Secondary | ICD-10-CM | POA: Diagnosis not present

## 2016-05-13 HISTORY — DX: Malignant neoplasm of colon, unspecified: C18.9

## 2016-05-13 LAB — CBC WITH DIFFERENTIAL/PLATELET
Basophils Absolute: 0 10*3/uL (ref 0.0–0.1)
Basophils Relative: 0 %
Eosinophils Absolute: 0.1 10*3/uL (ref 0.0–0.7)
Eosinophils Relative: 2 %
HCT: 25.5 % — ABNORMAL LOW (ref 39.0–52.0)
Hemoglobin: 7.9 g/dL — ABNORMAL LOW (ref 13.0–17.0)
Lymphocytes Relative: 26 %
Lymphs Abs: 1.4 10*3/uL (ref 0.7–4.0)
MCH: 21.3 pg — ABNORMAL LOW (ref 26.0–34.0)
MCHC: 31 g/dL (ref 30.0–36.0)
MCV: 68.7 fL — ABNORMAL LOW (ref 78.0–100.0)
Monocytes Absolute: 0.6 10*3/uL (ref 0.1–1.0)
Monocytes Relative: 11 %
Neutro Abs: 3.5 10*3/uL (ref 1.7–7.7)
Neutrophils Relative %: 62 %
Platelets: 248 10*3/uL (ref 150–400)
RBC: 3.71 MIL/uL — ABNORMAL LOW (ref 4.22–5.81)
RDW: 18.9 % — ABNORMAL HIGH (ref 11.5–15.5)
WBC: 5.6 10*3/uL (ref 4.0–10.5)

## 2016-05-13 LAB — BASIC METABOLIC PANEL
Anion gap: 8 (ref 5–15)
BUN: 22 mg/dL — ABNORMAL HIGH (ref 6–20)
CALCIUM: 8.1 mg/dL — AB (ref 8.9–10.3)
CO2: 28 mmol/L (ref 22–32)
Chloride: 95 mmol/L — ABNORMAL LOW (ref 101–111)
Creatinine, Ser: 1.15 mg/dL (ref 0.61–1.24)
GFR, EST NON AFRICAN AMERICAN: 53 mL/min — AB (ref >60)
Glucose, Bld: 110 mg/dL — ABNORMAL HIGH (ref 65–99)
POTASSIUM: 4.1 mmol/L (ref 3.5–5.1)
SODIUM: 131 mmol/L — AB (ref 135–145)

## 2016-05-13 LAB — URINALYSIS, ROUTINE W REFLEX MICROSCOPIC
Glucose, UA: 100 mg/dL — AB
Nitrite: POSITIVE — AB
Protein, ur: >300 mg/dL — AB
Specific Gravity, Urine: 1.01 (ref 1.005–1.030)
pH: 8 (ref 5.0–8.0)

## 2016-05-13 LAB — URINALYSIS, MICROSCOPIC (REFLEX)

## 2016-05-13 MED ORDER — SODIUM CHLORIDE 0.9 % IV BOLUS (SEPSIS)
250.0000 mL | Freq: Once | INTRAVENOUS | Status: AC
  Administered 2016-05-13: 250 mL via INTRAVENOUS

## 2016-05-13 NOTE — ED Notes (Signed)
Pt cleaned and new adult diaper applied

## 2016-05-13 NOTE — ED Provider Notes (Signed)
[facility] DEPT Provider Note   CSN: [id number] Arrival date & time: 05/13/16  2133    By signing my name below, I, Danny Buck, attest that this documentation has been prepared under the direction and in the presence of Danny Rank, MD. Electronically Signed: Macon Buck, ED Scribe. 05/13/16. 9:54 PM.  History   Chief Complaint Chief Complaint  Patient presents with  . Urinary Tract Infection   The history is provided by the patient. No language interpreter was used.    HPI Comments: Danny Buck is a 83 y.o. male with PMHx of HTN, HCC, arthritis, brought in by ambulance who presents to the Emergency Department presenting with moderate, difficulty urinating that occurred earlier today. Pt states he has been unable to urinate for the past five hours. Per nurse note, pt's family reports pt had a strong smelling, dark urine that pt voided in a urinal. He also notes mild swelling to his bilateral lower extremities but states this is his normal baseline. No modifying factors noted. Denies vomiting, diarrhea and abdominal pain.   Past Medical History:  Diagnosis Date  . Arthritis   . Colon cancer ([place])   . Complete heart block (HCC)    s/p PPM  . Hypertension   . Hypertension   . Pacemaker   . Peripheral arterial disease (HCC)    CT angiogram 08/26/2011 showed reduced ABIs bilaterally with ABI of 0.54on the right and 0.72 on the left -- s/p Bilat SFA Atherectomy & PTA-Stents (Dr. Gwenlyn Found);; Dopplers 07/2103 - RABI 0.68; LABI 0.77; patent SFA stents, Bilat ATA& PTA occlusion w/ 1 V runnoff (Peroneal A)    Patient Active Problem List   Diagnosis Date Noted  . Syncope 10/13/2013  . Bilateral leg edema 10/10/2013  . Persistent dry cough 10/10/2013  . Nonsustained ventricular tachycardia ([place]) 11/18/2012  . Paroxysmal atrial tachycardia ([place]) 11/18/2012  . Presence of permanent cardiac pacemaker 09/15/2012  . PAD (peripheral artery disease) ([place]) 09/15/2012  . Essential  hypertension 09/15/2012  . Fatigue 09/15/2012  . DOE (dyspnea on exertion) 09/15/2012  . Complete heart block ([place]) 09/15/2012  . Low back pain radiating to both legs 09/15/2012    Past Surgical History:  Procedure Laterality Date  . ABDOMINAL AORTAGRAM  12/12/2012   Procedure: ABDOMINAL Maxcine Ham;  Surgeon: Lorretta Harp, MD;  Location: [facility] CATH LAB;  Service: Cardiovascular;;  . FEMORAL ARTERY STENT  8 & 01/2013   Bilateral SFA Rotational Atherectomy & Stent.  . INSERT / REPLACE / REMOVE PACEMAKER    . KNEE SURGERY    . LOWER EXTREMITY ANGIOGRAM Bilateral 12/12/2012   Procedure: LOWER EXTREMITY ANGIOGRAM;  Surgeon: Lorretta Harp, MD;  Location: [facility] CATH LAB;  Service: Cardiovascular;  Laterality: Bilateral;  . LOWER EXTREMITY ANGIOGRAM N/A 02/13/2013   Procedure: LOWER EXTREMITY ANGIOGRAM;  Surgeon: Lorretta Harp, MD;  Location: [facility] CATH LAB;  Service: Cardiovascular;  Laterality: N/A;  . NM MYOVIEW LTD  01/2013   Small Inferoapical infarct; No ischemia; Normal EF  . PACEMAKER INSERTION  09/09/2011   Adapta Versa DR Dual chamber  [employer]  last checked 11/10/2011,   . PERCUTANEOUS STENT INTERVENTION Right 12/12/2012   Procedure: PERCUTANEOUS STENT INTERVENTION;  Surgeon: Lorretta Harp, MD;  Location: [facility] CATH LAB;  Service: Cardiovascular;  Laterality: Right;  atherectomy and stent to rt sfa  . PERCUTANEOUS STENT INTERVENTION Left 02/13/2013   Procedure: PERCUTANEOUS STENT INTERVENTION;  Surgeon: Lorretta Harp, MD;  Location: [facility] CATH LAB;  Service: Cardiovascular;  Laterality: Left;  lt sfa stent  . TRANSTHORACIC ECHOCARDIOGRAM  09/2011   EF 60%, otherwise normal       Home Medications    Prior to Admission medications   Medication Sig Start Date End Date Taking? Authorizing Provider  aspirin EC 81 MG tablet Take 81 mg by mouth daily.   Yes Historical Provider, MD  ferrous sulfate 325 (65 FE) MG tablet Take 325 mg by mouth daily with breakfast.   Yes Historical  Provider, MD  furosemide (LASIX) 20 MG tablet Take 1 tablet (20 mg total) by mouth daily. 08/06/15  Yes Leonie Man, MD  oxyCODONE (OXY IR/ROXICODONE) 5 MG immediate release tablet Take 5 mg by mouth every 4 (four) hours as needed for severe pain.   Yes Historical Provider, MD  Potassium Chloride ER 20 MEQ TBCR Take 20 mEq by mouth daily.   Yes Historical Provider, MD  cephALEXin (KEFLEX) 500 MG capsule Take 1 capsule (500 mg total) by mouth 4 (four) times daily. 05/14/16   Danny Rank, MD    Family History Family History  Problem Relation Age of Onset  . Cancer Son     colon  . Cancer Son     brain    Social History Social History  Substance Use Topics  . Smoking status: Former Smoker    Quit date: 07/12/1962  . Smokeless tobacco: Never Used     Comment: quit in 1964  . Alcohol use No     Allergies   Patient has no known allergies.   Review of Systems Review of Systems  Gastrointestinal: Negative for abdominal pain, diarrhea and vomiting.  Genitourinary: Positive for difficulty urinating.  All other systems reviewed and are negative.    Physical Exam Updated Vital Signs BP 143/56   Pulse 64   Temp 98.3 F (36.8 C) (Oral)   Resp 17   SpO2 100%   Physical Exam  Constitutional: No distress.  Elderly.   HENT:  Head: Normocephalic and atraumatic.  Right Ear: External ear normal.  Left Ear: External ear normal.  Eyes: Conjunctivae are normal. Right eye exhibits no discharge. Left eye exhibits no discharge. No scleral icterus.  Neck: Neck supple. No tracheal deviation present.  Cardiovascular: Normal rate, regular rhythm and intact distal pulses.   Pulmonary/Chest: Effort normal and breath sounds normal. No stridor. No respiratory distress. He has no wheezes. He has no rales.  Abdominal: Soft. Bowel sounds are normal. He exhibits no distension. There is no tenderness. There is no rebound and no guarding.  Musculoskeletal: He exhibits edema (bilateral lower  extremities ). He exhibits no tenderness.  Neurological: He is alert. He has normal strength. No cranial nerve deficit (no facial droop, extraocular movements intact, no slurred speech) or sensory deficit. He exhibits normal muscle tone. He displays no seizure activity. Coordination normal.  Skin: Skin is warm and dry. No rash noted.  Psychiatric: He has a normal mood and affect.  Nursing note and vitals reviewed.    ED Treatments / Results     COORDINATION OF CARE: 9:44 PM Discussed treatment plan with pt at bedside which includes labs and pt agreed to plan.   Labs (all labs ordered are listed, but only abnormal results are displayed) Labs Reviewed  BASIC METABOLIC PANEL - Abnormal; Notable for the following:       Result Value   Sodium 131 (*)    Chloride 95 (*)    Glucose, Bld 110 (*)    BUN 22 (*)  Calcium 8.1 (*)    GFR calc non Af Amer 53 (*)    All other components within normal limits  CBC WITH DIFFERENTIAL/PLATELET - Abnormal; Notable for the following:    RBC 3.71 (*)    Hemoglobin 7.9 (*)    HCT 25.5 (*)    MCV 68.7 (*)    MCH 21.3 (*)    RDW 18.9 (*)    All other components within normal limits  URINALYSIS, ROUTINE W REFLEX MICROSCOPIC - Abnormal; Notable for the following:    Color, Urine AMBER (*)    APPearance TURBID (*)    Glucose, UA 100 (*)    Hgb urine dipstick Buck (*)    Bilirubin Urine MODERATE (*)    Ketones, ur TRACE (*)    Protein, ur >300 (*)    Nitrite POSITIVE (*)    Leukocytes, UA MODERATE (*)    All other components within normal limits  URINALYSIS, MICROSCOPIC (REFLEX) - Abnormal; Notable for the following:    Bacteria, UA MANY (*)    Squamous Epithelial / LPF 6-30 (*)    All other components within normal limits  POC OCCULT BLOOD, ED - Abnormal; Notable for the following:    Fecal Occult Bld POSITIVE (*)    All other components within normal limits  URINE CULTURE    Procedures Procedures (including critical care  time)  Medications Ordered in ED Medications  cefTRIAXone (ROCEPHIN) 1 g in dextrose 5 % 50 mL IVPB (not administered)  sodium chloride 0.9 % bolus 250 mL (0 mLs Intravenous Stopped 05/13/16 2301)     Initial Impression / Assessment and Plan / ED Course  I have reviewed the triage vital signs and the nursing notes.  Pertinent labs & imaging results that were available during my care of the patient were reviewed by me and considered in my medical decision making (see chart for details).  Clinical Course   I was able to obtain additional history from the family when they arrived. The patient has history of colon cancer. He was diagnosed previously at the [place]. Patient opted for no surgical treatment. He has had intermittent episodes of anemia requiring blood transfusion associated with his colon cancer. The patient's last hemoglobin at the [place] was in the 7 range. Today's value is unchanged from his baseline. He does continue to have Hemoccult positive stools.  Family also states they're concerned about noticing some what appears to be stool in his urine. 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 Patient is certain he does not want any surgical intervention. I explained to family that unfortunately this before joint can occur as his cancer continues to progress and arose into adjacent structures.  I will give the patient dose of Rocephin. We'll send off a urine culture. Considering the patient's desire to not have any surgical intervention I do not think there is any indication for him  To be admitted to the hospital at this point.  Family is in agreement.  Final Clinical Impressions(s) / ED Diagnoses   Final diagnoses:  Acute cystitis with hematuria    New Prescriptions New Prescriptions   CEPHALEXIN (KEFLEX) 500 MG CAPSULE    Take 1 capsule (500 mg total) by mouth 4 (four) times daily.    I personally performed the services described in this documentation,  which was scribed in my presence.  The recorded information has been reviewed and is accurate.     Danny Rank, MD 05/14/16 [phone]

## 2016-05-13 NOTE — ED Notes (Signed)
64 ML noted in bladder per bladder scan.

## 2016-05-13 NOTE — ED Notes (Signed)
Assisted patient with peri care. Linens changed. Family at bedside.

## 2016-05-13 NOTE — ED Notes (Signed)
Pt given urinal.

## 2016-05-13 NOTE — ED Triage Notes (Signed)
Pt is from home, arrives via ems for family report of strong smelling, dark urine that pt voided in a urinal per ems.  Pt is awake, alert, oriented.

## 2016-05-14 LAB — POC OCCULT BLOOD, ED: Fecal Occult Bld: POSITIVE — AB

## 2016-05-14 MED ORDER — DEXTROSE 5 % IV SOLN
1.0000 g | Freq: Once | INTRAVENOUS | Status: AC
  Administered 2016-05-14: 1 g via INTRAVENOUS
  Filled 2016-05-14: qty 10

## 2016-05-14 MED ORDER — CEPHALEXIN 500 MG PO CAPS: 500.0000 mg | ORAL_CAPSULE | Freq: Four times a day (QID) | ORAL | 0 refills | Status: AC

## 2016-05-14 NOTE — Discharge Instructions (Signed)
Take the antibiotics as prescribed, return to the ED for worsening symptoms, fever, vomiting,

## 2016-05-15 LAB — URINE CULTURE

## 2016-07-01 DEATH — deceased

## 2016-08-16 ENCOUNTER — Ambulatory Visit: Payer: Medicare Other | Admitting: Podiatry
